# Patient Record
Sex: Female | Born: 1995 | Race: Black or African American | Hispanic: No | Marital: Single
Health system: Midwestern US, Academic
[De-identification: ages and names within clinical notes are randomized; demographics above are authoritative.]

## PROBLEM LIST (undated history)

## (undated) MED FILL — IBUPROFEN 800 MG TABLET: 800 800 MG | ORAL | Qty: 30 | Fill #0

## (undated) MED FILL — NORGESTIMATE 0.25 MG-ETHINYL ESTRADIOL 35 MCG TABLET: 0.25-35 0.25-35 mg-mcg | ORAL | 30 days supply | Qty: 28 | Fill #0

---
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
## 2014-04-29 ENCOUNTER — Ambulatory Visit (INDEPENDENT_AMBULATORY_CARE_PROVIDER_SITE_OTHER): Payer: Self-pay | Admitting: Physician Assistant

## 2014-04-29 VITALS — BP 100/58 | HR 100 | Temp 99.3°F | Resp 18 | Ht 63.0 in | Wt 110.0 lb

## 2014-04-29 DIAGNOSIS — R0981 Nasal congestion: Secondary | ICD-10-CM

## 2014-04-29 DIAGNOSIS — Z889 Allergy status to unspecified drugs, medicaments and biological substances status: Secondary | ICD-10-CM

## 2014-04-29 DIAGNOSIS — R112 Nausea with vomiting, unspecified: Secondary | ICD-10-CM

## 2014-04-29 DIAGNOSIS — Z9109 Other allergy status, other than to drugs and biological substances: Secondary | ICD-10-CM

## 2014-04-29 LAB — POCT CBC
Granulocyte percent: 83.3 %G — AB (ref 37–80)
HEMATOCRIT: 44.8 % (ref 37.7–47.9)
Hemoglobin: 14.5 g/dL (ref 12.2–16.2)
LYMPH, POC: 1.4 (ref 0.6–3.4)
MCH, POC: 29.7 pg (ref 27–31.2)
MCHC: 32.2 g/dL (ref 31.8–35.4)
MCV: 91.7 fL (ref 80–97)
MID (cbc): 0.4 (ref 0–0.9)
MPV: 8.6 fL (ref 0–99.8)
POC Granulocyte: 8.9 — AB (ref 2–6.9)
POC LYMPH PERCENT: 12.9 %L (ref 10–50)
POC MID %: 3.8 %M (ref 0–12)
Platelet Count, POC: 281 10*3/uL (ref 142–424)
RBC: 4.88 M/uL (ref 4.04–5.48)
RDW, POC: 13.3 %
WBC: 10.7 10*3/uL — AB (ref 4.6–10.2)

## 2014-04-29 MED ORDER — RANITIDINE HCL 150 MG PO TABS: 150.0000 mg | ORAL_TABLET | Freq: Two times a day (BID) | ORAL | Status: AC

## 2014-04-29 MED ORDER — CETIRIZINE HCL 10 MG PO TABS: 10.0000 mg | ORAL_TABLET | Freq: Every day | ORAL | Status: AC

## 2014-04-29 MED ORDER — HYDROXYZINE HCL 25 MG PO TABS: 25.0000 mg | ORAL_TABLET | Freq: Three times a day (TID) | ORAL | Status: AC | PRN

## 2014-04-29 NOTE — Progress Notes (Signed)
[facility] [address] [place] [address] [phone]  Date:  04/29/2014   Name:  Tina Haas   DOB:  08/22/1995   MRN:  [id number]  PCP:  Marshia Ly, PA-C    Chief Complaint: Headache; Sore Throat; Nausea; and sinus pain   History of Present Illness:  Tina Haas is a 19 y.o. very pleasant female patient who presents with the following:  Patent reports sinus pressure, sore throat, nausea, and headache for the last 2 days.  She states that the headache acutely came on, while she was doing her homework.  This was followed by a sore throat.  She states that she could not sleep due to the nausea, nasal congestion, and headache.  She states she had fever, but know temperature was not checked.  She states that the nasal congestion has resolved some, and there is still a slight headache at her left side.  She has nausea and one episode of emesis, of yellow material, that was mucus like.  She has no abdominal pain.  It did not have a strong odor.  Her cough is intermittently productive.  She has a hx of sore throat and mother is here with her because this is a concern.  This patient has had sorethroat about every other month since childhood.  It is usually strep negative.  Patient has recently received a referral for ent from pediatrician, and she also went to her student health.  Patient was dx with reflux and given prilosec.  She has not taken it for 2 days.  Today she states that she has more of an itchy throat and some nasal congestion.  Patient reports having spaghetti for dinner prior to symptom start. Through more , mother does recall an allergy of tomatoes.   Patient is not sexually active and has never been.      There are no active problems to display for this patient.   History reviewed. No pertinent past medical history.  History reviewed. No pertinent past surgical history.  History  Substance Use Topics  . Smoking status: Never Smoker    . Smokeless tobacco: Never Used  . Alcohol Use: No    History reviewed. No pertinent family history.  No Known Allergies  Medication list has been reviewed and updated.  Current Outpatient Prescriptions on File Prior to Visit  Medication Sig Dispense Refill  . norethindrone (MICRONOR,CAMILA,ERRIN) 0.35 MG tablet Take 1 tablet by mouth daily.     No current facility-administered medications on file prior to visit.    Review of Systems: ROS otherwise unremarkable unless listed above.   Physical Examination: Filed Vitals:   04/29/14 1210  BP: 100/58  Pulse: 100  Temp: 99.3 F (37.4 C)  Resp: 18   Filed Vitals:   04/29/14 1210  Height:  (1.6 m)  Weight: 110 lb (49.896 kg)   Body mass index is 19.49 kg/(m^2). Ideal Body Weight: Weight in (lb) to have BMI = 25: 140.8  Physical Exam  Constitutional: She is oriented to person, place, and time. She appears well-developed and well-nourished. No distress.  HENT:  Head: Normocephalic.  Right Ear: Tympanic membrane, external ear and ear canal normal.  Left Ear: Tympanic membrane, external ear and ear canal normal.  Nose: Mucosal edema and rhinorrhea present. Right sinus exhibits no maxillary sinus tenderness and no frontal sinus tenderness. Left sinus exhibits no maxillary sinus tenderness and no frontal sinus tenderness.  Mouth/Throat: Oropharynx is clear and moist. No  uvula swelling. No oropharyngeal exudate, posterior oropharyngeal edema or posterior oropharyngeal erythema.  Eyes: EOM are normal. Pupils are equal, round, and reactive to light. Right eye exhibits no discharge. Left eye exhibits no discharge. Right conjunctiva is not injected. Left conjunctiva is not injected.  Neck: Normal range of motion.  Cardiovascular: Normal rate, regular rhythm and normal heart sounds.   Pulmonary/Chest: Effort normal and breath sounds normal. No respiratory distress. She has no wheezes.  Abdominal: Soft. Bowel sounds are normal.  She exhibits no distension and no mass. There is no tenderness.  Lymphadenopathy:       Head (right side): No tonsillar adenopathy present.       Head (left side): No tonsillar adenopathy present.    She has no cervical adenopathy.  Neurological: She is alert and oriented to person, place, and time. She has normal reflexes. No cranial nerve deficit. Coordination normal.  Skin: She is not diaphoretic.  Psychiatric: She has a normal mood and affect. Her behavior is normal.     Results for orders placed or performed in visit on 04/29/14  POCT CBC  Result Value Ref Range   WBC 10.7 (A) 4.6 - 10.2 K/uL   Lymph, poc 1.4 0.6 - 3.4   POC LYMPH PERCENT 12.9 10 - 50 %L   MID (cbc) 0.4 0 - 0.9   POC MID % 3.8 0 - 12 %M   POC Granulocyte 8.9 (A) 2 - 6.9   Granulocyte percent 83.3 (A) 37 - 80 %G   RBC 4.88 4.04 - 5.48 M/uL   Hemoglobin 14.5 12.2 - 16.2 g/dL   HCT, POC 65.744.8 84.637.7 - 47.9 %   MCV 91.7 80 - 97 fL   MCH, POC 29.7 27 - 31.2 pg   MCHC 32.2 31.8 - 35.4 g/dL   RDW, POC 96.213.3 %   Platelet Count, POC 281 142 - 424 K/uL   MPV 8.6 0 - 99.8 fL    Assessment and Plan: 19 year old female is here for chief complaint of nausea vomiting, congestion and throat pain.  Allergic response and reflux vs viral illness.  Given recurrent hx., advising patient to schedule referral to ent, and address allergies.  Advised patient to return if sxs worsen with fever, cough, ect.    Nausea and vomiting, vomiting of unspecified type - Plan: POCT CBC, ranitidine (ZANTAC) 150 MG tablet, hydrOXYzine (ATARAX/VISTARIL) 25 MG tablet  Nasal congestion - Plan: cetirizine (ZYRTEC) 10 MG tablet, ranitidine (ZANTAC) 150 MG tablet, hydrOXYzine (ATARAX/VISTARIL) 25 MG tablet  Multiple allergies - Plan: hydrOXYzine (ATARAX/VISTARIL) 25 MG tablet, Ambulatory referral to Allergy  Trena PlattStephanie Yatzil Clippinger, PA-C [facility] 3/28/20162:10 PM

## 2014-04-29 NOTE — Patient Instructions (Addendum)
Please schedule your referral appointment Take the zantac for 30 days. Continue to take the zyrtec past this time. Please take 1/2 to 1 full tablet of vistaril at night only.   If you continue to have these symptoms, please contact your student health or return to our clinic.    Food Choices for Gastroesophageal Reflux Disease When you have gastroesophageal reflux disease (GERD), the foods you eat and your eating habits are very important. Choosing the right foods can help ease the discomfort of GERD. WHAT GENERAL GUIDELINES DO I NEED TO FOLLOW?  Choose fruits, vegetables, whole grains, low-fat dairy products, and low-fat meat, fish, and poultry.  Limit fats such as oils, salad dressings, butter, nuts, and avocado.  Keep a food diary to identify foods that cause symptoms.  Avoid foods that cause reflux. These may be different for different people.  Eat frequent small meals instead of three large meals each day.  Eat your meals slowly, in a relaxed setting.  Limit fried foods.  Cook foods using methods other than frying.  Avoid drinking alcohol.  Avoid drinking large amounts of liquids with your meals.  Avoid bending over or lying down until 2-3 hours after eating. WHAT FOODS ARE NOT RECOMMENDED? The following are some foods and drinks that may worsen your symptoms: Vegetables Tomatoes. Tomato juice. Tomato and spaghetti sauce. Chili peppers. Onion and garlic. Horseradish. Fruits Oranges, grapefruit, and lemon (fruit and juice). Meats High-fat meats, fish, and poultry. This includes hot dogs, ribs, ham, sausage, salami, and bacon. Dairy Whole milk and chocolate milk. Sour cream. Cream. Butter. Ice cream. Cream cheese.  Beverages Coffee and tea, with or without caffeine. Carbonated beverages or energy drinks. Condiments Hot sauce. Barbecue sauce.  Sweets/Desserts Chocolate and cocoa. Donuts. Peppermint and spearmint. Fats and Oils High-fat foods, including [place] fries  and potato chips. Other Vinegar. Strong spices, such as black pepper, white pepper, red pepper, cayenne, curry powder, cloves, ginger, and chili powder. The items listed above may not be a complete list of foods and beverages to avoid. Contact your dietitian for more information. Document Released: 01/18/2005 Document Revised: 01/23/2013 Document Reviewed: 11/22/2012 [facility] Patient Information 2015 [place], [place]. This information is not intended to replace advice given to you by your health care provider. Make sure you discuss any questions you have with your health care provider.

## 2014-05-02 ENCOUNTER — Telehealth: Payer: Self-pay

## 2014-05-02 NOTE — Telephone Encounter (Signed)
Nausea and vomiting, vomiting of unspecified type - Plan: POCT CBC, ranitidine (ZANTAC) 150 MG tablet, hydrOXYzine (ATARAX/VISTARIL) 25 MG tablet  Nasal congestion - Plan: cetirizine (ZYRTEC) 10 MG tablet, ranitidine (ZANTAC) 150 MG tablet, hydrOXYzine (ATARAX/VISTARIL) 25 MG tablet  Multiple allergies - Plan: hydrOXYzine (ATARAX/VISTARIL) 25 MG tablet, Ambulatory referral to Allergy  Trena PlattStephanie English, PA-C [facility] 3/28/20162:10 PM  Judeth CornfieldStephanie verbally ok'd note. Called pt to see which days she missed since she was seen on the 28th. Unable to leave message

## 2014-05-02 NOTE — Telephone Encounter (Signed)
Pt returned call, note written until 3/31. Faxed to school.

## 2014-05-02 NOTE — Telephone Encounter (Signed)
Patient is requesting a three day out of school note.    Fax Number:  [phone]  [phone]  Patient

## 2015-04-04 ENCOUNTER — Ambulatory Visit: Admit: 2015-04-04 | Discharge: 2015-04-04 | Payer: PRIVATE HEALTH INSURANCE

## 2015-04-04 ENCOUNTER — Inpatient Hospital Stay: Admit: 2015-04-04 | Payer: PRIVATE HEALTH INSURANCE

## 2015-04-04 DIAGNOSIS — M546 Pain in thoracic spine: Secondary | ICD-10-CM

## 2015-04-04 DIAGNOSIS — M545 Low back pain, unspecified: Secondary | ICD-10-CM

## 2015-04-04 DIAGNOSIS — M4185 Other forms of scoliosis, thoracolumbar region: Secondary | ICD-10-CM

## 2015-04-04 NOTE — Unmapped (Signed)
Chief Complaint   Patient presents with   ??? New Patient Visit/ Consultation     thoracic/lumbar pain       HPI:      Erica Esparza is a 20 y.o. female who presents for evaluation of mid and low back pain.  The symptoms localize to the left side of her lower thoracic/upper lumbar region.  They have been present since middle school.  She reports noticing sometime in middle school that she had a mild curve to her spine.  She thinks it may have progressed a small amount since noticing the curve.  She is unsure of any changes recently.  She takes OTC medications for pain with fair results.  She has not had any forms of therapy at this point.  She denies any numbness, tingling or weakness or any issues with bowel/bladder function.  The patient is referred by self referral.    Symptoms started:      10 years ago    History of specific injury:      No  Change in activity:       No  History of previous hip problems:    No  History of previous back problems:    No  Seen for this problem before:     No    The patient rates their pain at rest:     2/10  Pain at its worst rates:     6/10   The pain quality is:      aching  Symptoms are exacerbated by:     mechanical activity of the spine    The patient reports:    night pain:     No    weakness:     No     swelling:     No    bruising:     No    catching:     No    numbness:     No    tingling:     No    new bowel/bladder problems:   No    Prior treatment has included: Prior surgery at this site: No      Ice:    No      Pain medications:  ibuprofen and tylenol      Physical therapy:  No      Steroid injection:  No      Other:    No    Work/School:   Yes  Exercise/Sports:  Yes      History reviewed. No pertinent past medical history.      History reviewed. No pertinent past surgical history.      History reviewed. No pertinent family history.      Medication    Current outpatient prescriptions:   ???  CYANOCOBALAMIN, VITAMIN B-12, (VITAMIN B-12 ORAL), Take by mouth., Disp: , Rfl:        Allergies  No Known Allergies      Review of Symptoms    Pertinent items are noted in HPI.    Physical Examination:    Constitutional: This is a pleasant female, alert, and in no acute distress who appears stated age.    Filed Vitals:    04/04/15 1001   Height: 5' 3 (1.6 m)   Weight: 95 lb (43.092 kg)       Psychiatric:  The patient's mood is normal and appropriate for their visit       Gait:   Gait:   normal  Heel walk:  normal      Toe walk:  normal     Vascular exam:   Symmetric pulses in bilateral lower extremities, no edema.     Neuro exam:   Symmetric light touch in bilateral lower extremities     Reflexes:      Patellar:2+ bilaterally      Achilles:2+ bilaterally         Lumbar Spine exam:     On observation there is evidence of a mild thoracolumbar scoliosis    Tenderness:     Right    Left    Paraspinous muscles : []                    [x]     Mid-line:   []       []      Buttock/gluteal:  []       []      Iliac crest:   []       []      Other:    []      []      ROM:      Flexion: normal     Extension: normal              Right     Left  Rotation:  normal     normal  Lateral bend:  normal     normal        Tests:    Straight leg raise:  normal     normal           Babinski Sign:     downgoing     downgoing     Strength:      L Hip Flexors 5/5  R Hip Flexors 5/5  L Quadriceps 5/5  R Quadriceps 5/5  L Hamstring 5/5  R Hamstring 5/5  L Tibialis anterior 5/5  R Tibialis anterior 5/5  L Extensor hallucis longus 5/5  R Extensor hallucis longus 5/5  L Gastrocnemius 5/5  R Gastrocnemius 5/5      Skin:    Warm, dry    No erythema    No rash    No wounds or open lesions       Imaging:    X-rays of the thoracic and lumbar spines dated 04/04/2015 were reviewed and discussed with the patient.  These films revealed: a mild scoliotic curvature of the thoracolumbar spine measuring approximately 23 degrees.       Assessment:    1. Thoracolumbar back pain  Physical Therapy   2. Scoliosis of thoracolumbar spine, unspecified  scoliosis type  Physical Therapy   3. Adolescent idiopathic scoliosis of thoracolumbar region           Plan:    Erica Esparza is a 20 y.o. female with mild thoracic and low back pain likely secondary to musculoskeletal back pain.  I discussed her findings in detail and reassured her that her scoliosis was mild.  I told her that with her pain level being relatively mild and not having had any therapy that she should start with a course of PT and that this will likely help with her pain.    Discussed the patient's diagnosis, exam, imaging (if applicable), and treatment options:  [x]   Discussed return to activity considerations, including the need to avoid exacerbating activity, high impact, or painful activity  [x]   Discussed the use of pain medication, muscle relaxer, steroids and nonsteroidal anti-inflammatory agents including the risk of side effects which include  but not limited to:  the risk of increased BP, bleeding, renal, and heart involvement.     []  Patient to consider naprosyn or ibuprofen for 1-2 weeks with food.  The patient may also consider acetaminophen if needed and medically able to take.   []  The patient was given a prescription for a Medrol dose pack and instructed to follow package directions.    []  Prescription given for pain medication, muscle relaxer or anti-inflammatory (see Epic orders)   [x]  May continue on their previously prescribed pain regimen   [x]   Can consider Heat/Ice 3-4 x/day for 15-20 min for adjunctive pain control.  [x]   Discussed referral to Pain Management, Physical Medicine & Rehabilitation and/or Psychiatry    []  Patient elected for a referral to   [x]   Weight management discussed as part of a healthy spine plan.   [x]   Reviewed the role of a rehabilitative exercise program:   [x]  Patient elected for a referral to physical therapy     []  Patient elected for a home based program and instruction was given in the                    office today  [x]   Reviewed the role of  additional diagnostic and treatment options including the role of imaging, EMG, injection therapy, and possible surgical considerations.  At this time we elected to:   [x]  Monitor their response to initial treatment recommendations prior to pursing these additional options which can be considered based on their progress.     []  Further diagnostic testing orders were placed for.   []  Referral for steroid injections was placed.   []  Referral to surgeon for consult to discuss surgical options was placed  Follow-up    [x]  prn.  Patient was instructed to contact us with any problems, questions or any neurological change/deterioration.    []    A copy of my note was sent electronically to the referring provider through our shared medical record or automated fax system.    Duane Lope, PA-C  04/04/2015
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
## 2015-05-11 DIAGNOSIS — R1013 Epigastric pain: Secondary | ICD-10-CM

## 2015-05-11 NOTE — Unmapped (Signed)
Patient presents to ED with chief complaints of nausea without emesis, abd pain with what patient believes is acid reflux x 4 days beginning Wednesday morning.  S/s have been getting progressively worse.

## 2015-05-12 ENCOUNTER — Inpatient Hospital Stay
Admit: 2015-05-12 | Discharge: 2015-05-12 | Disposition: A | Discharge status: Home / Self Care | Payer: PRIVATE HEALTH INSURANCE

## 2015-05-12 LAB — BASIC METABOLIC PANEL
Anion Gap: 8 mmol/L (ref 3–16)
BUN: 17 mg/dL (ref 7–25)
CO2: 25 mmol/L (ref 21–33)
Calcium: 9.7 mg/dL (ref 8.6–10.3)
Chloride: 102 mmol/L (ref 98–110)
Creatinine: 1.12 mg/dL (ref 0.60–1.30)
Glucose: 172 mg/dL (ref 70–100)
Osmolality, Calculated: 286 mOsm/kg (ref 278–305)
Potassium: 6 mmol/L (ref 3.5–5.3)
Sodium: 135 mmol/L (ref 133–146)
eGFR AA CKD-EPI: 82 See note.
eGFR NONAA CKD-EPI: 71 See note.

## 2015-05-12 LAB — DIFFERENTIAL
Basophils Absolute: 34 /uL (ref 0–200)
Basophils Relative: 0.6 % (ref 0.0–1.0)
Eosinophils Absolute: 112 /uL (ref 15–500)
Eosinophils Relative: 2 % (ref 0.0–8.0)
Lymphocytes Absolute: 2274 /uL (ref 850–3900)
Lymphocytes Relative: 40.6 % (ref 15.0–45.0)
Monocytes Absolute: 577 /uL (ref 200–950)
Monocytes Relative: 10.3 % (ref 0.0–12.0)
Neutrophils Absolute: 2604 /uL (ref 1500–7800)
Neutrophils Relative: 46.5 % (ref 40.0–80.0)
nRBC: 0 /100 WBC (ref 0–0)

## 2015-05-12 LAB — URINALYSIS-MACROSCOPIC W/REFLEX TO MICROSCOPIC
Bilirubin, UA: NEGATIVE
Blood, UA: NEGATIVE
Glucose, UA: NEGATIVE mg/dL
Ketones, UA: NEGATIVE mg/dL
Leukocytes, UA: NEGATIVE
Nitrite, UA: NEGATIVE
Protein, UA: NEGATIVE mg/dL
Specific Gravity, UA: 1.025 (ref 1.005–1.035)
Urobilinogen, UA: 1 EU/dL (ref 0.2–1.0)
pH, UA: 6 (ref 5.0–8.0)

## 2015-05-12 LAB — CBC
Hematocrit: 41 % (ref 35.0–45.0)
Hemoglobin: 13.7 g/dL (ref 11.7–15.5)
MCH: 30.4 pg (ref 27.0–33.0)
MCHC: 33.4 g/dL (ref 32.0–36.0)
MCV: 91.2 fL (ref 80.0–100.0)
MPV: 9.5 fL (ref 7.5–11.5)
Platelets: 267 10*3/uL (ref 140–400)
RBC: 4.5 10*6/uL (ref 3.80–5.10)
RDW: 13.2 % (ref 11.0–15.0)
WBC: 5.6 10*3/uL (ref 3.8–10.8)

## 2015-05-12 LAB — HEPATIC FUNCTION PANEL
ALT: 14 U/L (ref 7–52)
AST: 35 U/L (ref 13–39)
Albumin: 4.2 g/dL (ref 3.5–5.7)
Alkaline Phosphatase: 63 U/L (ref 36–125)
Bilirubin, Direct: 0 mg/dL (ref 0.0–0.4)
Bilirubin, Indirect: 0.3 mg/dL (ref 0.0–1.1)
Total Bilirubin: 0.3 mg/dL (ref 0.0–1.5)
Total Protein: 7.7 g/dL (ref 6.4–8.9)

## 2015-05-12 LAB — HCG URINE, QUALITATIVE: Preg Test, Ur: NEGATIVE

## 2015-05-12 LAB — LIPASE: Lipase: 24 U/L (ref 4–82)

## 2015-05-12 LAB — POTASSIUM: Potassium: 3.7 mmol/L (ref 3.5–5.3)

## 2015-05-12 MED ORDER — ondansetron (ZOFRAN ODT) 4 MG disintegrating tablet
4 | ORAL_TABLET | Freq: Three times a day (TID) | ORAL | Status: AC | PRN
Start: 2015-05-12 — End: 2015-09-01

## 2015-05-12 MED ORDER — lidocaine HCl (XYLOCAINE) 2 % viscous soln Soln 15 mL
2 | Freq: Once | Status: AC
Start: 2015-05-12 — End: 2015-05-12
  Administered 2015-05-12: 05:00:00 15 mL via ORAL

## 2015-05-12 MED ORDER — aluminum & magnesium hydroxide-simethicone (MYLANTA, MAALOX) suspension 30 mL
400-400-40 | Freq: Once | ORAL | Status: AC
Start: 2015-05-12 — End: 2015-05-12
  Administered 2015-05-12: 05:00:00 30 mL via ORAL

## 2015-05-12 MED ORDER — ondansetron (ZOFRAN) 4 mg/2 mL injection 4 mg
4 | Freq: Once | INTRAMUSCULAR | Status: AC
Start: 2015-05-12 — End: 2015-05-12
  Administered 2015-05-12: 05:00:00 4 mg via INTRAVENOUS

## 2015-05-12 MED ORDER — lactated ringers 1,000 mL IV fluid
Freq: Once | INTRAVENOUS | Status: AC
Start: 2015-05-12 — End: 2015-05-12
  Administered 2015-05-12: 05:00:00 via INTRAVENOUS

## 2015-05-12 MED ORDER — pantoprazole (PROTONIX) 20 MG tablet
20 | ORAL_TABLET | Freq: Every morning | ORAL | Status: AC
Start: 2015-05-12 — End: 2015-09-01

## 2015-05-12 MED FILL — ONDANSETRON HCL (PF) 4 MG/2 ML INJECTION SOLUTION: 4 4 mg/2 mL | INTRAMUSCULAR | Qty: 2

## 2015-05-12 MED FILL — LIDOCAINE VISCOUS 2 % MUCOSAL SOLUTION: 2 2 % | Qty: 15

## 2015-05-12 MED FILL — MAG-AL PLUS EXTRA STRENGTH 400 MG-400 MG-40 MG/5 ML ORAL SUSPENSION: 400-400-40 400-400-40 mg/5 mL | ORAL | Qty: 30

## 2015-05-12 NOTE — Unmapped (Signed)
ED Attending Attestation Note    Date of service:  05/11/2015    This patient was seen by the resident physician.  I have seen and examined the patient, agree with the workup, evaluation, management and diagnosis. The care plan has been discussed and I concur.     My assessment reveals a 20 y.o. female who presents emergency Department with a four-day history of a cold, characterized by a nonproductive cough without shortness of breath and then she awakened this morning and noted nausea with reduced appetite but she has not had any vomiting.  She had a bowel movement earlier today which was normal for her.  She just completed a menstrual period which she also reports was normal in duration, and timing for her.  She denies that she's having any urinary frequency, urinary hesitancy, or blood in her urine.  There's been no unusual odor to the urine.  She has felt hot but has not documented a fever.  She reports a past history of reflux which she states makes my throat swell.  She has not had abdominal pain associate with reflux symptoms in the past.  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    On examination she was in no acute distress.  Her skin was warm and dry.  There is no conjunctival pallor or scleral icterus.  Her oropharynx is clear and mucous membranes are moist and pink.  Her lungs are clear with equal breath sounds and there were no wheezes rales or rhonchi.  Air movement was good.  Cardiac exam showed a regular rate and rhythm with a normal S1 and S2 that she was tachycardic to 110 at the time of my examination.  Her abdomen had intact bowel sounds.  It was soft and flat.  She had very mild suprapubic tenderness and left upper quadrant tenderness without rebound or guarding.  There is lesser tenderness in her periumbilical region.  The remainder the abdomen was nontender.Marland Kitchen

## 2015-05-12 NOTE — Unmapped (Signed)
You were seen in the Emergency Department today for abdominal pain.  Take pantoprazole once daily.  Take Zofran as needed for nausea.    Drink plenty of fluids (at least 3-5 glasses of water each day) and be sure to eat plenty of fiber.     Return to the Emergency Department if:     ?? you develop any fevers   ?? you experience worsening or new abdominal pain   ?? you experience nausea or vomiting that keeps you from being able to eat or drink  ?? or you have any new concerns.    Please follow-up with your primary care doctor within 1 week if you continue to experience new or ongoing symptoms.

## 2015-05-12 NOTE — Unmapped (Signed)
[place] ED Note    Date of service:  05/11/2015    Reason for Visit: Nausea; Gastroesophageal Reflux; and Abdominal Pain      Patient History     HPI:  Bryanah Sidell is a 20 y.o. female with PMHx of GERD  presents with chief complaint of Nausea; Gastroesophageal Reflux; and Abdominal Pain    Patient presents for abdominal pain.The patient, she's had abdominal pain since Wednesday, 3 days.  She describes it as epigastric, associated with reflux, nonradiating, constant.  It is not associated with food.  It is similar to previous episodes of gastroesophageal reflux which is on the past.  She has had nausea but no emesis.  She denies diarrhea.  She has no lower abdominal pain, dysuria, urgency, frequency.  She just ended her menstrual period.    Aside from the above, patient denies any aggravating or alleviating factors or associated symptoms.          Past Medical History   Diagnosis Date   ??? GERD (gastroesophageal reflux disease)        History reviewed. No pertinent past surgical history.    Yajahira Tison  reports that she has never smoked. She does not have any smokeless tobacco history on file. She reports that she does not drink alcohol or use illicit drugs.    Previous Medications    CYANOCOBALAMIN, VITAMIN B-12, (VITAMIN B-12 ORAL)    Take by mouth.       Allergies:   Allergies as of 05/11/2015   ??? (No Known Allergies)       Review of Systems     ROS:  As in HPI. All systems reviewed and otherwise negative. Specifically, positive forEpigastric pain, nausea but no fever, chills, headache and weakness, numbness, shortness of breath, chest pain  ROS    Physical Exam     ED Triage Vitals   Vital Signs Group      Temp 05/12/15 0002 98.4 ??F (36.9 ??C)      Temp Source 05/12/15 0002 Oral      Heart Rate 05/12/15 0002 122      Heart Rate Source 05/12/15 0002 Monitor      Resp 05/12/15 0002 15      SpO2 05/12/15 0002 100 %      BP 05/12/15 0002 115/85 mmHg      BP  Location 05/12/15 0002 Left arm      BP Method 05/12/15 0002 Automatic      Patient Position 05/12/15 0002 Sitting   SpO2 05/12/15 0002 100 %   O2 Device 05/12/15 0002 None (Room air)       General:Well-appearing African American female in no apparent distress    HEENT:  Normocephalic, atraumatic. Mucous membranes are moist.     Neck:  Supple, trachea midline      Pulmonary:   Lungs clear to auscultation bilaterally. No increased work of breathing.    Cardiac:  Regular rate and rhythm. Normal S1 and S2. No murmurs/rubs/gallops.    Abdomen: Soft, tender to palpation of the epigastrium, no rebound, no guarding.  No lower abdominal tenderness.  Normal active bowel sounds.    Extremities: 2+ radial pulses, no deformity, swelling, tenderness     Skin:  No rashes or bruising,    Neuro:  Alert and oriented x3. Speech normal. Moves UE and LE spontaneously.     Psych:  Mood and affect appropriate for situation.       Diagnostic Studies  Labs:    Please see electronic medical record for any tests performed in the ED     Radiology:    Please see electronic medical record for any tests performed in the ED    EKG:  None     Emergency Department Procedures     Procedures      ED Course and MDM     Brendalee Matthies is a 20 y.o. female with a history and presentation as described above in HPI.  The patient was evaluated by myself and the ED Attending Physician, Dr. Marylou Flesher. All management and disposition plans were discussed and agreed upon. The patient was roomed and an IV was placed. Appropriate labs and diagnostic studies were reviewed as they were made available. Pertinent laboratory studies in medical decision making are listed below.     Upon presentation, the patient was Given an enema was stable.    Patient arrived for epigastric pain.  She has a history of reflux.  She had no peritoneal signs on abdominal exam.  She had no right upper quadrant tenderness consistent with hepatobiliary pathology.  She had no leukocytosis,  LFTs and lipase within normal limits, BMP showed potassium of 6.0 initially, repeat was 3.7.  Patient was given 1 L bolus, GI cocktail which helped with her pain.  She was given Zofran which alleviated her nausea.    I believe patient has dyspepsia.  She'll be put on pantoprazole daily.  She'll take Zofran as needed for nausea.  She is instructed to return for new or worsening symptoms, inability to tolerate by mouth.    Risks, benefits, and alternatives were discussed. At this time the patient has been deemed safe for discharge. My customary discharge instructions including strict return precautions for worsening or new symptoms have been communicated.    Consults:      None   Summary of Treatment in ED:    Medications   lactated ringers 1,000 mL IV fluid ( Intravenous New Bag 05/12/15 0101)   ondansetron (ZOFRAN) 4 mg/2 mL injection 4 mg (4 mg Intravenous Given 05/12/15 0102)   aluminum & magnesium hydroxide-simethicone (MYLANTA, MAALOX) suspension 30 mL (30 mLs Oral Given 05/12/15 0102)     And   lidocaine HCl (XYLOCAINE) 2 % viscous soln Soln 15 mL (15 mLs Oral Given 05/12/15 0102)           Impression     1. Dyspepsia             Cecilie Kicks, MD  Resident  05/12/15 [phone]

## 2015-05-12 NOTE — Unmapped (Signed)
Pt updated on plan of care. No questions at this time. Family at bedside.

## 2015-05-12 NOTE — Unmapped (Signed)
Pt c/o abdominal pain and nausea starting early Sunday morning. Pt denies vomiting. Pt denies chest pain or shortness of breath.

## 2015-08-29 ENCOUNTER — Encounter: Payer: PRIVATE HEALTH INSURANCE | Attending: Adult Health

## 2015-09-01 ENCOUNTER — Encounter: Payer: PRIVATE HEALTH INSURANCE | Attending: Adult Health

## 2015-09-01 ENCOUNTER — Ambulatory Visit: Admit: 2015-09-01 | Payer: PRIVATE HEALTH INSURANCE

## 2015-09-01 DIAGNOSIS — N946 Dysmenorrhea, unspecified: Secondary | ICD-10-CM

## 2015-09-01 MED ORDER — ondansetron (ZOFRAN) 4 MG tablet
4 | ORAL_TABLET | Freq: Three times a day (TID) | ORAL | Status: AC | PRN
Start: 2015-09-01 — End: 2017-02-25

## 2015-09-01 NOTE — Unmapped (Signed)
Subjective  HPI:   Patient ID: Erica Esparza is a 20 y.o. female.    Chief Complaint: 'Endometriosis' per pt    HPI Comments:   Pt c/o intermittent nausea and lower abdominal/pelvic cramping x 2-3 days  Pt admits to hx of dysmenorrhea, usually occurs on first day of menses, lasts all day  She feels her sxns have become more severe over past few months  Has not tried any rx or otc meds for recent episode  Pt states her old OBGYN had dx endometriosis in past, but pt denies hx of imaging or biopsy    LMP: 08/30/15; States usually regular in cycle/duration  Birth control: N/A at this time, pt self d/c last August due to no menstrual sx relief  Sexual hx: Currently in relationship with female partner, denies sexual activity at this time  Last saw OBGYN in hometown 1-2 years ago; Has not established GYN locally             Medications:   Current Outpatient Prescriptions on File Prior to Visit   Medication Sig Dispense Refill   ??? [DISCONTINUED] CYANOCOBALAMIN, VITAMIN B-12, (VITAMIN B-12 ORAL) Take by mouth.     ??? [DISCONTINUED] ondansetron (ZOFRAN ODT) 4 MG disintegrating tablet Take 1 tablet (4 mg total) by mouth every 8 hours as needed for Nausea. 20 tablet 0   ??? [DISCONTINUED] pantoprazole (PROTONIX) 20 MG tablet Take 1 tablet (20 mg total) by mouth every morning before breakfast. 30 tablet 0     No current facility-administered medications on file prior to visit.            ROS:   Review of Systems   Constitutional: Positive for appetite change. Negative for fever, chills and fatigue.   Gastrointestinal: Negative for abdominal pain.   Genitourinary: Positive for menstrual problem and pelvic pain. Negative for vaginal pain.   Neurological: Negative for headaches.       Filed Vitals:    09/01/15 1044   BP: 127/73   Pulse: 91   Temp: 98 ??F (36.7 ??C)   TempSrc: Oral   Height: 5' 2.99 (1.6 m)   Weight: 99 lb (44.906 kg)     Body mass index is 17.54 kg/(m^2).  Body surface area is 1.41 meters squared.     Objective:          Physical Exam   Constitutional: She is oriented to person, place, and time. She appears well-developed and well-nourished.   HENT:   Head: Normocephalic and atraumatic.   Cardiovascular: Normal rate, regular rhythm and normal heart sounds.    Pulmonary/Chest: Effort normal and breath sounds normal. No respiratory distress.   Abdominal: Soft. Bowel sounds are normal. She exhibits no distension. There is tenderness. There is no rebound and no guarding.       Neurological: She is alert and oriented to person, place, and time.               Assessment/Plan:     1. Dysmenorrhea  - Gynecology referral provided      - Ondansetron (ZOFRAN) 4 MG tablet: Take PRN nausea as directed

## 2015-09-01 NOTE — Unmapped (Signed)
Dysmenorrhea  Menstrual cramps (dysmenorrhea) are caused by the muscles of the uterus tightening (contracting) during a menstrual period. For some women, this discomfort is merely bothersome. For others, dysmenorrhea can be severe enough to interfere with everyday activities for a few days each month.  Primary dysmenorrhea is menstrual cramps that last a couple of days when you start having menstrual periods or soon after. This often begins after a teenager starts having her period. As a woman gets older or has a baby, the cramps will usually lessen or disappear. Secondary dysmenorrhea begins later in life, lasts longer, and the pain may be stronger than primary dysmenorrhea. The pain may start before the period and last a few days after the period.   CAUSES   Dysmenorrhea is usually caused by an underlying problem, such as:  ?? The tissue lining the uterus grows outside of the uterus in other areas of the body (endometriosis).  ?? The endometrial tissue, which normally lines the uterus, is found in or grows into the muscular walls of the uterus (adenomyosis).  ?? The pelvic blood vessels are engorged with blood just before the menstrual period (pelvic congestive syndrome).  ?? Overgrowth of cells (polyps) in the lining of the uterus or cervix.  ?? Falling down of the uterus (prolapse) because of loose or stretched ligaments.  ?? Depression.  ?? Bladder problems, infection, or inflammation.  ?? Problems with the intestine, a tumor, or irritable bowel syndrome.  ?? Cancer of the female organs or bladder.  ?? A severely tipped uterus.  ?? A very tight opening or closed cervix.  ?? Noncancerous tumors of the uterus (fibroids).  ?? Pelvic inflammatory disease (PID).  ?? Pelvic scarring (adhesions) from a previous surgery.  ?? Ovarian cyst.  ?? An intrauterine device (IUD) used for birth control.  RISK FACTORS  You may be at greater risk of dysmenorrhea if:  ?? You are younger than age 30.  ?? You started puberty early.  ?? You have  irregular or heavy bleeding.  ?? You have never given birth.  ?? You have a family history of this problem.  ?? You are a smoker.  SIGNS AND SYMPTOMS   ?? Cramping or throbbing pain in your lower abdomen.  ?? Headaches.  ?? Lower back pain.  ?? Nausea or vomiting.  ?? Diarrhea.  ?? Sweating or dizziness.  ?? Loose stools.  DIAGNOSIS   A diagnosis is based on your history, symptoms, physical exam, diagnostic tests, or procedures. Diagnostic tests or procedures may include:  ?? Blood tests.  ?? Ultrasonography.  ?? An examination of the lining of the uterus (dilation and curettage, D&C).  ?? An examination inside your abdomen or pelvis with a scope (laparoscopy).  ?? X-rays.  ?? CT scan.  ?? MRI.  ?? An examination inside the bladder with a scope (cystoscopy).  ?? An examination inside the intestine or stomach with a scope (colonoscopy, gastroscopy).  TREATMENT   Treatment depends on the cause of the dysmenorrhea. Treatment may include:  ?? Pain medicine prescribed by your health care provider.  ?? Birth control pills or an IUD with progesterone hormone in it.  ?? Hormone replacement therapy.  ?? Nonsteroidal anti-inflammatory drugs (NSAIDs). These may help stop the production of prostaglandins.  ?? Surgery to remove adhesions, endometriosis, ovarian cyst, or fibroids.  ?? Removal of the uterus (hysterectomy).  ?? Progesterone shots to stop the menstrual period.  ?? Cutting the nerves on the sacrum that go to the female organs (  presacral neurectomy).  ?? Electric current to the sacral nerves (sacral nerve stimulation).  ?? Antidepressant medicine.  ?? Psychiatric therapy, counseling, or group therapy.  ?? Exercise and physical therapy.  ?? Meditation and yoga therapy.  ?? Acupuncture.  HOME CARE INSTRUCTIONS   ?? Only take over-the-counter or prescription medicines as directed by your health care provider.  ?? Place a heating pad or hot water bottle on your lower back or abdomen. Do not sleep with the heating pad.  ?? Use aerobic exercises, walking,  swimming, biking, and other exercises to help lessen the cramping.  ?? Massage to the lower back or abdomen may help.  ?? Stop smoking.  ?? Avoid alcohol and caffeine.  SEEK MEDICAL CARE IF:   ?? Your pain does not get better with medicine.  ?? You have pain with sexual intercourse.  ?? Your pain increases and is not controlled with medicines.  ?? You have abnormal vaginal bleeding with your period.  ?? You develop nausea or vomiting with your period that is not controlled with medicine.  SEEK IMMEDIATE MEDICAL CARE IF:   You pass out.      This information is not intended to replace advice given to you by your health care provider. Make sure you discuss any questions you have with your health care provider.     Document Released: 01/18/2005 Document Revised: 09/20/2012 Document Reviewed: 07/06/2012  Elsevier Interactive Patient Education ??2016 [employer]

## 2015-09-18 ENCOUNTER — Ambulatory Visit: Payer: PRIVATE HEALTH INSURANCE

## 2015-09-24 ENCOUNTER — Ambulatory Visit: Payer: PRIVATE HEALTH INSURANCE

## 2015-10-16 ENCOUNTER — Ambulatory Visit: Payer: PRIVATE HEALTH INSURANCE

## 2015-10-16 NOTE — Progress Notes (Deleted)
Chief complaint: ***    HPI:  Erica Esparza is a 20 y.o. [id number] who presents for evaluation of above. Patient reports ***    I have reviewed and updated the patient's past medical, surgical, family, social history, medications and allergies    ***    O:  There were no vitals taken for this visit.    Gen: NAD, A&O   HEENT: atraumatic, normocephalic   Chest: normal work of breathing   Abd: soft, ***  Pelvic: normal appearing external genitalia, urethral meatus, normal appearing vaginal mucosa, normal appearing visually closed cervix with scant discharge.***  Bimanual: small, firm, mobile uterus in midline, ***verted, no appreciable adnexal masses or tenderness, no cervical motion tenderness    ***    A/P:  ***    Dispo: ***    Reinaldo Raddle, MD

## 2015-11-26 ENCOUNTER — Inpatient Hospital Stay
Admit: 2015-11-26 | Discharge: 2015-11-26 | Disposition: A | Discharge status: Home / Self Care | Payer: PRIVATE HEALTH INSURANCE

## 2015-11-26 DIAGNOSIS — E162 Hypoglycemia, unspecified: Secondary | ICD-10-CM

## 2015-11-26 LAB — CBC
Hematocrit: 38.5 % (ref 35.0–45.0)
Hemoglobin: 13.3 g/dL (ref 11.7–15.5)
MCH: 32.2 pg (ref 27.0–33.0)
MCHC: 34.4 g/dL (ref 32.0–36.0)
MCV: 93.7 fL (ref 80.0–100.0)
MPV: 9.2 fL (ref 7.5–11.5)
Platelets: 216 10*3/uL (ref 140–400)
RBC: 4.12 10*6/uL (ref 3.80–5.10)
RDW: 13.6 % (ref 11.0–15.0)
WBC: 5.6 10*3/uL (ref 3.8–10.8)

## 2015-11-26 LAB — BASIC METABOLIC PANEL
Anion Gap: 7 mmol/L (ref 3–16)
BUN: 7 mg/dL (ref 7–25)
CO2: 24 mmol/L (ref 21–33)
Calcium: 8.7 mg/dL (ref 8.6–10.3)
Chloride: 108 mmol/L (ref 98–110)
Creatinine: 0.88 mg/dL (ref 0.60–1.30)
Glucose: 167 mg/dL (ref 70–100)
Osmolality, Calculated: 290 mOsm/kg (ref 278–305)
Potassium: 3.8 mmol/L (ref 3.5–5.3)
Sodium: 139 mmol/L (ref 133–146)
eGFR AA CKD-EPI: >90 See note.
eGFR NONAA CKD-EPI: >90 See note.

## 2015-11-26 LAB — DIFFERENTIAL
Basophils Absolute: 34 /uL (ref 0–200)
Basophils Relative: 0.6 % (ref 0.0–1.0)
Eosinophils Absolute: 34 /uL (ref 15–500)
Eosinophils Relative: 0.6 % (ref 0.0–8.0)
Lymphocytes Absolute: 795 /uL (ref 850–3900)
Lymphocytes Relative: 14.2 % (ref 15.0–45.0)
Monocytes Absolute: 358 /uL (ref 200–950)
Monocytes Relative: 6.4 % (ref 0.0–12.0)
Neutrophils Absolute: 4379 /uL (ref 1500–7800)
Neutrophils Relative: 78.2 % (ref 40.0–80.0)
nRBC: 0 /100 WBC (ref 0–0)

## 2015-11-26 LAB — HEMOGLOBIN A1C: Hemoglobin A1C: 4.9 % (ref 4.8–6.4)

## 2015-11-26 LAB — TSH: TSH: 0.7 u[IU]/mL (ref 0.34–5.60)

## 2015-11-26 LAB — PREGNANCY QUALITATIVE, SERUM, UCMC ONLY: Preg, Serum: NEGATIVE

## 2015-11-26 MED ORDER — blood-glucose meter (GLUCOSE MONITORING KIT) kit
PACK | 0 refills | 30.00000 days | Status: AC
Start: 2015-11-26 — End: ?

## 2015-11-26 MED ORDER — blood sugar diagnostic (GLUCOSE BLOOD) Strp
0 refills | 30.00000 days | Status: AC
Start: 2015-11-26 — End: ?

## 2015-11-26 MED ORDER — sodium chloride 0.9 % 1,000 mL IV fluid
Freq: Once | INTRAVENOUS | Status: AC
Start: 2015-11-26 — End: 2015-11-26
  Administered 2015-11-26: 15:00:00 1000 mL via INTRAVENOUS

## 2015-11-26 NOTE — ED Notes (Signed)
Phlebotomy recalled for blood to be drawn- enroute

## 2015-11-26 NOTE — ED Triage Notes (Signed)
Patient had episode of syncope at school followed by a seizure. Found to have low BS. Did not eat this morning. Gives history of similar events in the past

## 2015-11-26 NOTE — Consults (Signed)
ENDOCRINOLOGY CONSULT NOTE      11/26/2015  4:22 PM  Referring Physician: Dr Dartha Lodge  Consult Attending: Dr [name] is an 20 y.o. female.     Reason for Consult: Hypoglycemia    Chief Complaint and History of Present Illness:   Patient is a 20 year old female with a PMH of GERD who presents today after an episode of hypoglycemia. Patient states that she woke at 8:30 am and went to class at 9 am. She denied eating anything. She had been feeling nauseous on waking up. She felt light headed and had a feeling of passing out. She was too weak to leave the class. She remembers blanking out intermittently and was told that she had a seizure. EMS was called. Glucose check was 37. She took 2 glucagon gel packets (presume she meant glucose gel packets) and felt better after 15 minutes. Once she had received IVF, she felt a lot better.   In the past, she has had her glucose checked once during a class. She was told her glucose was really low despite eating an hour ago. She thinks it was ~60. She describes frequent episodes of nausea/vomiting/light headedness. She reported episodes since the age of 41. Around 10 episodes since that age. However, on further questioning she described more frequent episodes near her period time. She has a suspicion for endometriosis. On first days, she feels weak along with associated symptoms. Feels better after she eats food. She feels like her symptoms come on after prolonged fasting or skipping meals.   She speculates that her mother has current diabetes since she still checks her glucose. She had gestational DM since 12 years. Patient can not state for sure if her mother takes medications for diabetes.    History:  Past Medical History:   Diagnosis Date    GERD (gastroesophageal reflux disease)      History reviewed. No pertinent surgical history.  Family History   Problem Relation Age of Onset    Diabetes Mother      gestational dm     Social History   Substance Use  Topics    Smoking status: Never Smoker    Smokeless tobacco: Never Used    Alcohol use No     No Known Allergies    Prior to Admission Meds:  (Not in a hospital admission)  Recent Steroid Use: No  Code Status: Full Code  Pertinent items are noted in HPI.    Vitals:  Blood pressure 131/72, pulse 99, temperature 98 F (36.7 C), temperature source Oral, resp. rate 10, last menstrual period 11/26/2015, SpO2 99 %.    BP 131/72 (BP Location: Right arm, Patient Position: Sitting)   Pulse 99   Temp 98 F (36.7 C) (Oral)   Resp 10   LMP 11/26/2015 (Exact Date)   SpO2 99%     General Appearance:    Alert, cooperative, no distress, appears stated age   Head:    Normocephalic, without obvious abnormality, atraumatic   Eyes:    PERRL, conjunctiva/corneas clear, EOM's intact   Throat:   Lips, mucosa, and tongue normal; teeth and gums normal   Neck:   Supple, symmetrical   Back:     Symmetric, no curvature, ROM normal, no CVA tenderness   Lungs:     Clear to auscultation bilaterally, respirations unlabored    Heart:    Regular rate and rhythm, S1 and S2 normal, no murmur, rub   or  gallop   Abdomen:     Soft, non-tender, bowel sounds active all four quadrants,     no masses, no organomegaly   Extremities:   Extremities normal, atraumatic, no cyanosis or edema   Pulses:   2+ and symmetric all extremities   Skin:   Skin color, texture, turgor normal, no rashes or lesions   Neurologic:  No focal deficits         Labs:Lab Results   Component Value Date    GLUCOSE 167 (H) 11/26/2015    BUN 7 11/26/2015    CREATININE 0.88 11/26/2015    K 3.8 11/26/2015    NA 139 11/26/2015    CL 108 11/26/2015    CALCIUM 8.7 11/26/2015     Lab Results   Component Value Date    HGBA1C 4.9 11/26/2015     Lab Results   Component Value Date    WBC 5.6 11/26/2015    HGB 13.3 11/26/2015    HCT 38.5 11/26/2015    MCV 93.7 11/26/2015    PLT 216 11/26/2015     Lab Results   Component Value Date    TSH 0.70 11/26/2015     No results found for:  TSHRECEPTAB  No results found for: THYROGLB  No results found for: ANTITHYPERAB  No results found for: PROLACTIN, LABPROL, LABLH, FSH, LABFSH, TESTOSTERONE, ESTROGEN, LABIGF, GH, LABACTH, PREGNENACTH, LABACTH, CORTISOL  Lab Results   Component Value Date    OSMOLALITY 290 11/26/2015     No results found for: LABPROT  Lab Results   Component Value Date    CALCIUM 8.7 11/26/2015       Assessment/Plan:    Patient is a 20 year old female with a PMH of GERD presenting with hypoglycemia    Recommendations  - Looks like there may be a physiological reason for her hypoglycemia in relation to her periods vs dietary indiscretions  - Recommend keeping a diary of symptoms to help [place] ascertain the frequency  - Advised to maintain a well balanced diet  - Patient will need a glucometer along with strips to measure her glucose on a PRN basis when these episodes occur  - We will plan on setting an appointment for a continuous glucose monitor for 2 week around the time of her menstrual cycle  - She will need a fasting BMP, proinsulin, c-peptide, insulin levels, Hba1c and a fructosamine before her appt  - We would like to see her in clinic once all the data is accumulated. Please place an outpatient consult for an endocrine referral.     Thank you for the consult  Patient was seen and discussed with Dr Noel Gerold    Endocrine Fellow Pager: [phone]  Tish Men  11/26/2015       I have seen and examined Erica Esparza & reviewed pertinent medical data on 11/26/15.  I discussed with the fellow and agree with the findings and plan as documented in the note.    History available from both the patient and her boy friend with whom she lives.  Initial history was of a limited number of spells suggestive of nervousness and anxiety documented on limited occasions in this petite (BMI 17.5) 20 yo but as we went on there seemed to be some longer duration.  We elicited some history to suggest that these were more frequent just after her menstrual  periods analogous to catamenial pattern of hypoglycemia in some women with diabetes corresponding to when insulin resistance rising prior to  menses is reversed.    Some suggestion that this may be related to patient's nutrition.  My suspicion for autonomous endogenous insulin secretion is low and the duration of symptoms elicited lowers the likelihood of exogenous insulin and/or insulin secretagogue but I did recommend getting a urine sulfonylurea level at the time of this ER visit.    New availability of 2 week long, modest cost continuous glucose monitoring without need for fingersticks for calibration provides the opportunity to do the equivalent of a Holter for glucose while patient in her home environment.  Given the history reported with some ambiguity as to whether or not it is related to menses, I suggested that the initial testing be timed to start one week before menses and run for two weeks until one week after start of menses.  We can re-test from there.      Ellamae Sia, MD  12/11/2015  11:17 AM

## 2015-11-26 NOTE — ED Notes (Signed)
Pt resting in bed quietly. Pt updated on plan of care, denies any needs at this time.

## 2015-11-26 NOTE — ED Provider Notes (Signed)
ED Attending Attestation Note    Date of service:  11/26/2015    This patient was seen by the advanced practice provider.  I have seen and examined the patient, agree with the workup, evaluation, management and diagnosis. The care plan has been discussed.  I have reviewed the ECG and concur with the advanced practice provider's interpretation.    My assessment reveals a 20 y.o. female with sudden onset lightheadedness, subsequent seizure.  FSBS on scene 30.  Given glucagon and feels better.  Will evaluate labs for possible causes of hypoglycemia, however ultimately if labs are unremarkable and no further seizure activity, this was likely a hypoglycemia related seizure.  Will refer to primary care, encourage regular meals and having snacks available to her.

## 2015-11-26 NOTE — ED Triage Notes (Signed)
Patient presents to the Emergency Department with report from EMS of patient with seizure.  Patient with no history of seizures.  Patient found with glucose of 31 and given Glucagon X2.  Patient now with glucose of 97.  Patient denies nausea, vomiting, or diarrhea.  Patient is awake, A&OX4.  No acute distress noted.  Respirations easy and unlabored.  Skin warm and dry.

## 2015-11-26 NOTE — Unmapped (Signed)
[place] ED Note    Date of Service: 11/26/2015    Reason for Visit: Hypoglycemia-Symptomatic      Patient History     HPI:  This is a 20 y.o. female who denies significant PMH presenting with syncope vs seizure. Patient reports that she was in class around 9AM. She reports that she was seated at her desk when she began to feel lightheaded, warm and nauseous. She reports that classmates/teachers elevated her lower extremities and laid her on the ground. She reports feeling that her feet were numb. She reports that she was told she then had a seizure. No reported tongue biting or incontinence. Patient reports she may have been confused afterwards. She was noted by EMS on arrival to have a glucose of 31. She was given Glucagon x2 and now has a glucose of 97. She reports that she now feels improved. She denies HA, dizziness, vision changes, cp, sob, n/v, focal weakness, numbness/tingling currently. She reports that she has had syncopal episodes in the past but she has never been worked up for it. She states that she usually eats something and feels better. She states that she had nothing to eat this morning.    Patient also reports she started her menstrual cycle today. She reports a hx of severe abdominal crampint with her cycles. She reports that she believes she has hx of endometrosis but has never been diagnosed.     With the exception of the above, there are no aggravating or alleviating factors.    Past Medical History:   Diagnosis Date   ??? GERD (gastroesophageal reflux disease)        History reviewed. No pertinent surgical history.    Virgin Zellers  reports that she has never smoked. She has never used smokeless tobacco. She reports that she does not drink alcohol or use drugs.    Patient's Medications   New Prescriptions    No medications on file   Previous Medications    ONDANSETRON (ZOFRAN) 4 MG TABLET    Take 1 tablet (4 mg total) by mouth every 8 hours  as needed for Nausea.   Modified Medications    No medications on file   Discontinued Medications    No medications on file       Allergies:   Allergies as of 11/26/2015   ??? (No Known Allergies)       PMH: Nursing notes reviewed   PSH: Nursing notes reviewed   FH: Nursing notes reviewed   MEDS: Nursing notes and chart reviewed         Review of Systems     Review of Systems   Constitutional: Positive for diaphoresis. Negative for chills and fever.   HENT: Negative for congestion.    Eyes: Negative for blurred vision and redness.   Respiratory: Negative for shortness of breath.    Cardiovascular: Negative for chest pain and leg swelling.   Gastrointestinal: Positive for abdominal pain and nausea. Negative for vomiting.   Genitourinary: Negative for dysuria.   Musculoskeletal: Negative for myalgias.   Skin: Negative for rash.   Neurological: Positive for dizziness, sensory change, seizures and loss of consciousness. Negative for tingling, focal weakness and headaches.   Psychiatric/Behavioral: Negative for hallucinations.         Physical Exam     Vitals:    11/26/15 1003 11/26/15 1102   BP: 112/71 110/70   BP Location: Right arm Right arm   Patient Position: Sitting Lying  Pulse: 60 65   Resp: 10 21   Temp: 98 ??F (36.7 ??C)    TempSrc: Oral    SpO2: 100% 100%       Physical Exam   Constitutional: She is oriented to person, place, and time and well-developed, well-nourished, and in no distress.   HENT:   Head: Normocephalic and atraumatic.   Eyes: Conjunctivae and EOM are normal. Pupils are equal, round, and reactive to light.   Neck: Normal range of motion. Neck supple.   Cardiovascular: Normal rate, regular rhythm and normal heart sounds.    Pulmonary/Chest: Effort normal and breath sounds normal. No respiratory distress. She has no wheezes. She has no rales.   Abdominal: Soft. Bowel sounds are normal. She exhibits no distension. There is no tenderness. There is no rebound and no guarding.   Musculoskeletal: Normal  range of motion. She exhibits no edema or deformity.   Neurological: She is alert and oriented to person, place, and time. She has normal sensation, normal strength and intact cranial nerves. She shows no pronator drift. Gait normal. Coordination normal. GCS score is 15.   Skin: Skin is warm and dry.   Psychiatric: Mood and affect normal.   Nursing note and vitals reviewed.            Diagnostic Studies     Labs: The attending and I have reviewed laboratory findings.  Labs were reviewed and and significant values identified.    Please see electronic medical record for any tests performed in the ED     Labs Reviewed   BASIC METABOLIC PANEL   CBC   DIFFERENTIAL   PREGNANCY QUALITATIVE, SERUM, [facility] ONLY   TSH       EKG:  Seen and Interpreted by myself and the attending physician, Dr. Ellis Savage    Indication: Syncope  Finding: Normal sinus rhythm with sinus arrhythmia. No evidence for acute ischemia or ST segment changes.  Ventricular Rate: 74  PR: Interval: 156  QRS Duration: 70  QT: 372  QTc: 412  P-R-T axes: 86-57-84      ED Course and MDM     Vital signs, medical history, social history, allergies and nursing notes reviewed.    Vitals:  BP 110/70 (BP Location: Right arm, Patient Position: Lying)    Pulse 65    Temp 98 ??F (36.7 ??C) (Oral)    Resp 21    LMP 11/26/2015 (Exact Date)    SpO2 100%     Briefly this is a is a 20 y.o. female who presents to the emergency department after syncopal episode. Patient reports she was in class when she developed lightheadedness, diaphoresis, nausea. Bystanders reported that patient had seizure. On EMS arrival, patient's BG was 37. After glucagon x 2, BG was 97. Patient reports feeling improved, asymptomatic after this. On exam, she is hemodynamically stable with normal vitals. Non-focal neurologic exam. Please see HPI and physical for further details. Ddx includes vasovagal, less likely cardiac given age and no cp/sob, anemia, dehydration, hypoglycemia. Will obtain EKG, orthostatics,  CBC, BMP, TSH, HCG.        The patient was seen and evaluated by the attending physician Dr. Ellis Savage who agreed with the assessment and plan.  The patient and / or the family were informed of the results of any tests, a time was given to answer questions, a plan was proposed and they agreed with plan.       At this time I am going off-service and will be  signing out care of this patient to my colleague Donnie Coffin, NP for further care. My colleague's responsibilities will include:  -follow-up lab results  -disposition pending results    Medications administered in the ED:    Medications   sodium chloride 0.9 % 1,000 mL IV fluid (1,000 mLs Intravenous New Bag 11/26/15 1059)       Impression     No diagnosis found. - pending    Plan     Disposition - pending           Hipolito Bayley, [place]  11/26/15 1225

## 2015-11-26 NOTE — ED Notes (Signed)
Patient expressed verbal understanding in dc instructions including home care if sugar drops, monitoring and f/u care appointment.

## 2015-11-26 NOTE — ED Notes (Signed)
Patient given cranberry juice to drink

## 2015-11-26 NOTE — Unmapped (Signed)
If you have any problems or repeat episodes of low blood sugar before the 2 week monitor is placed, you can call [phone] and ask for the Endocrine Fellow to be paged.  Remove call block from your phone as they will not be able to get through otherwise.  They should contact you to have a continuous blood glucose monitor placed.    U will be given a prescription for a blood glucose monitor.  You should check your blood sugar if you feel onset of symptoms of low blood sugar.  Record these readings to show the endocrinologist.  If you have a reading less than 80, you should eat or drink something containing sugar.

## 2015-11-26 NOTE — ED Provider Notes (Signed)
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 I resumed care of this patient from Pixie Casino PA; please see their dictation for complete history and physical exam. At time of patient turnover, the patient was pending labs, tertiary eval, MDM and Endocrinology consult given her frequent episodes of hypoglycemia and today's seizure activity secondary to hypoglycemia at 37.     Blood pressure 110/70, pulse 65, temperature 98 F (36.7 C), temperature source Oral, resp. rate 21, last menstrual period 11/26/2015, SpO2 100 %.    RESULTS:  Labs Reviewed   BASIC METABOLIC PANEL - Abnormal; Notable for the following:        Result Value    Glucose 167 (*)     All other components within normal limits    Narrative:     As of 04/02/2014 the estimated GFR is calculated from serum creatinine using the Chronic Kidney Disease  Epidemiology Collaboration (CKD-EPI) equation in patients 18 years and older.  The reference range is   >60 mL/min/1.32m2.  eGFR values greater than 90 will be reported as >74mL/min/1.73m2.  Reference: Cherie Dark AS, Suella [facility], Stefano Gaul AF, 3rd, Feldman HI, et. al.  A new equation to estimate glomerular filtration rate.  Ann Intern Med. 2009:150(9):604-12   DIFFERENTIAL - Abnormal; Notable for the following:     Lymphocytes Relative 14.2 (*)     Lymphocytes Absolute 795 (*)     All other components within normal limits   CBC   PREGNANCY QUALITATIVE, SERUM, [facility] ONLY   TSH   HEMOGLOBIN A1C     Clinical Impression: hypoglycemia    Disposition: D/C home with blood glucose monitoring and 2 week follow up with Endocrinology Fellow.       Jorge Ny, CNP  11/26/15 1618       Jorge Ny, CNP  11/26/15 [phone]

## 2015-12-10 NOTE — Telephone Encounter (Signed)
Patient calling. Patient was seen in [facility] ED by Dr Noel Gerold . Referral states Dr Noel Gerold wanted to have  2 week monitoring for blood sugar set up. Please contact patient with appt and monitoring. Patient can be reached at either verified number above.

## 2015-12-16 NOTE — Telephone Encounter (Signed)
Pt called call center back today as no one has called her to have the Libra Pro put on so I scheduled for tomorrow and sent message to [facility].  Char

## 2015-12-17 ENCOUNTER — Ambulatory Visit: Admit: 2015-12-17 | Discharge: 2015-12-17 | Payer: PRIVATE HEALTH INSURANCE

## 2015-12-17 DIAGNOSIS — E162 Hypoglycemia, unspecified: Secondary | ICD-10-CM

## 2015-12-17 NOTE — Unmapped (Signed)
As the on-site supervising physician, I agree with the recommendations and plan listed below.  Ellamae Sia MD

## 2015-12-17 NOTE — Unmapped (Signed)
Attached FreeStyle Libre Pro Flash Glucose Monitoring System to back of the patient's right upper arm.  Sensor lot #: [id number], SN #: [id number], Exp: 03/03/2016.  Patient tolerated well.  Instruction given on data collection while wearing the [facility], reinforcing when to test BG and to write down her symptoms and her food.  Pt is to return for scanning and removal on 12/31/15 and to return log sheets.  Instructed patient to call with any questions.    Shane Crutch, RN, CDE

## 2015-12-24 ENCOUNTER — Ambulatory Visit: Admit: 2015-12-24 | Discharge: 2015-12-24 | Payer: PRIVATE HEALTH INSURANCE | Attending: "Endocrinology

## 2015-12-24 DIAGNOSIS — E162 Hypoglycemia, unspecified: Secondary | ICD-10-CM

## 2015-12-24 NOTE — Unmapped (Signed)
The patient's sensor came off yesterday.  She brought the sensor with her and I was able to scan it for data.  Attached FreeStyle Libre Pro Flash Glucose Monitoring System to back of the patient's right upper arm.  Sensor lot #: [id number], SN #: [id number], Exp: 03/03/2016.  Patient tolerated well.  Instruction given on data collection while wearing the [employer], reinforcing when to test BG.  Pt is to return for removal on 12/31/15 and to return log sheets.  Instructed patient to call with any questions.    Shane Crutch, RN, CDE

## 2015-12-24 NOTE — Unmapped (Signed)
I have reviewed the case and details presented by Maggie Greene. I agree with the assessment and plan as documented.    Shamieka Gullo P Ania Levay MD  Assistant Professor  Division of Endocrinology

## 2015-12-31 ENCOUNTER — Ambulatory Visit: Admit: 2015-12-31 | Discharge: 2015-12-31 | Payer: PRIVATE HEALTH INSURANCE

## 2015-12-31 DIAGNOSIS — E162 Hypoglycemia, unspecified: Secondary | ICD-10-CM

## 2015-12-31 NOTE — Unmapped (Signed)
As the on-site supervising physician, I agree with the recommendations and plan listed below.  I preliminarily reviewed the downloaded continuous glucose monitoring and it appears to confirm the preliminary history of more hypoglycemia in this patient without diabetes during the week after menses started than in the prior week.  Pt to have appointment scheduled with Tish Men MD, endocrinology fellow, to review the results and develop a plan.    Ellamae Sia MD

## 2015-12-31 NOTE — Unmapped (Signed)
FreeStyle Libre Pro Flash Glucose Monitoring System was successfully scanned for data and the sensor was removed without complication.  Patient returned completed log sheets.  Patient does not currently have a follow up appt scheduled.    Shane Crutch, RN, CDE

## 2016-01-12 NOTE — Telephone Encounter (Signed)
Attempted calling patient to confirm appt for 03/2016, per Dr. Eloise Harman

## 2016-02-17 ENCOUNTER — Ambulatory Visit: Admit: 2016-02-17 | Discharge: 2016-02-17 | Payer: PRIVATE HEALTH INSURANCE

## 2016-02-17 DIAGNOSIS — N809 Endometriosis, unspecified: Secondary | ICD-10-CM

## 2016-02-17 MED ORDER — ibuprofen (ADVIL,MOTRIN) 800 MG tablet
800 | ORAL_TABLET | Freq: Three times a day (TID) | ORAL | 2 refills | Status: AC | PRN
Start: 2016-02-17 — End: 2016-02-18

## 2016-02-17 MED ORDER — norgestimate-ethinyl estradiol (SPRINTEC, 28,) 0.25-35 mg-mcg per tablet
0.25-35 | ORAL_TABLET | Freq: Every day | ORAL | 11 refills | 30.00000 days | Status: AC
Start: 2016-02-17 — End: 2016-02-18

## 2016-02-17 NOTE — Unmapped (Signed)
Here for pain

## 2016-02-17 NOTE — Progress Notes (Signed)
No RN Exit - see MD documentation.  Rosalie Doctor, RN

## 2016-02-17 NOTE — Progress Notes (Signed)
Chief complaint: endometriosis    HPI:  Erica Esparza is a 21 y.o. G0 who presents for evaluation of above. Patient reports was diagnosed at age 52. Was in class with severe pain. Thought I was going to die. Had significant emesis. Cannot stay still, rolls around in pain. Has poor appetite secondary to pain.     Had previously been on OCPs for endometriosis ~18-19, but discontinued after moving. Was on for 6-8 months with some improvement. Was also taking high dose tylenol and ibuprofen during menses.    GynHx:  Denies history of paps  Denies history of STDs    Menstrual history:  Menstrual History  Age of Menarche: 47  Period Cycle (Days): 30  Period Duration (Days): 4-7  Period Pattern: Regular  Menstrual Flow: Heavy  Menstrual Control: Maxi pad  Menstrual Control Change Freq (Hours): 6  Dysmenorrhea: (!) Severe  Dysmenorrhea Symptoms: Cramping  Menopausal?: no   Aching, sharp, cramping, stabbing occasionally and twisting.    I have reviewed and updated the patient's past medical, surgical, family, social history, medications and allergies    O:  BP 122/75 (BP Location: Right arm, Patient Position: Sitting, BP Cuff Size: Regular)   Pulse 96   Temp 96.8 F (36 C) (Oral)   Ht 5' 3 (1.6 m)   Wt 100 lb 14.4 oz (45.8 kg)   LMP 01/27/2016   BMI 17.87 kg/m     Gen: NAD, A&O   HEENT: atraumatic, normocephalic   Chest: normal work of breathing   Abd: soft, thin, NT, ND  Pelvic: normal appearing external genitalia, urethral meatus, normal appearing vaginal mucosa, normal appearing visually closed cervix with scant discharge.  Bimanual: small, firm, mobile uterus in midline, anteverted, no appreciable adnexal masses or tenderness, no cervical motion tenderness.    A/P:  Endometriosis: based on symptoms. Will begin OCPs for medical management. Discussed use of timed NSAIDs.     ?DM: has been checking blood sugars. Reports well controlled with diet. Follows with PCP    Dispo: return in 2-3 months to discuss  symptoms on OCPs    Reinaldo Raddle, MD

## 2016-02-18 NOTE — Telephone Encounter (Signed)
Pt asking if Rx was sent in. Informed pt orders are pended to MD and are awaiting signature

## 2016-02-18 NOTE — Telephone Encounter (Signed)
Prescription sent to wrong pharmacy. Updated pharmacy in epic to [facility]. Please send new RX

## 2016-02-18 NOTE — Telephone Encounter (Signed)
Pt states she needs a form completed for school from Dr Noel Gerold. Also a statement on letterhead. ( Pt states she was in the ED on 10/25 and with Dr Noel Gerold on 11/15 due to a seizure) . Pt states fax will be coming to office from [facility].       Pt states letter needs to include that due to the seizure she was unable to complete the course and needs to be excused from the class to be reimbursed the fees.  Due to this, the pt is unable to register for classes this semester.     Pt asking for lab results from that visit.  Pls advise.  Pt phone [phone]

## 2016-02-19 MED ORDER — norgestimate-ethinyl estradiol (SPRINTEC, 28,) 0.25-35 mg-mcg per tablet
0.25-35 | ORAL_TABLET | Freq: Every day | ORAL | 11 refills | 30.00000 days | Status: AC
Start: 2016-02-19 — End: ?

## 2016-02-19 MED ORDER — ibuprofen (ADVIL,MOTRIN) 800 MG tablet
800 | ORAL_TABLET | Freq: Three times a day (TID) | ORAL | 2 refills | Status: AC | PRN
Start: 2016-02-19 — End: ?

## 2016-02-19 NOTE — Telephone Encounter (Signed)
Attempted to advise patient of prescription being sent to correct pharmacy. Number on file does not have voicemail set up. If patient calls back please advise.

## 2016-02-20 NOTE — Telephone Encounter (Signed)
As of today have not received form,

## 2016-02-20 NOTE — Telephone Encounter (Signed)
Informed pt Rx sent to correct pharmacy. Pt states she was told to inform the office if she is unable to obtain her Rx. Pt states she currently lost her health insurance. Not sure when she will be able to obtain coverage. Informed pt to check with [facility] to verify cost of medication. Advised pt if too expensive, to call office and we can send Rx for Sprintec to [facility] as it is on the $9 list. Pt verbalized understanding.

## 2016-02-27 NOTE — Unmapped (Signed)
Spoke to patient, she will send form that needs to be filled out.

## 2016-02-27 NOTE — Unmapped (Signed)
Pt requesting letter for school - please contact [phone] to obtain more info from pt.  Asking for office e-mail address to contact Dr. Noel Gerold.

## 2016-03-12 ENCOUNTER — Ambulatory Visit: Payer: PRIVATE HEALTH INSURANCE

## 2016-03-18 NOTE — Unmapped (Signed)
Pt requesting letter for school - please contact [phone] to obtain more info from pt. Can you please refax this letter, college told the patient they were experiencing trouble with their fax machine. Please follow up.

## 2016-03-19 NOTE — Unmapped (Signed)
Patient is requesting to    [   ] Speak to the Nurse :    [   ] Schedule a procedure:    [   ] Status of paper work:    [   ] Labs to be entered into [place]    [   ] Request sooner appointment with Dr.     [   ] Medication question    [   ] Patient is returning call     [   ] Inform the Provider:    [   ] Schedule a New Patient Appointment with the following Provider:     [ XXX  ] Other:Pt following up on prev msg. Says she would like a letter stating medical condition documented on 03/03/16 sent to:    Olin Hauser   [address] [facility] [address]   [address]   [place], [place] [address]    Would also like a call or email when mailed.

## 2016-03-23 NOTE — Telephone Encounter (Signed)
Printed and mailed letter.  Erica Esparza was informed

## 2016-03-23 NOTE — Telephone Encounter (Signed)
Pt following up on request for letter.  Please phone [phone] to advise.

## 2016-03-23 NOTE — Telephone Encounter (Signed)
Pt states her menses is 5-6 days late.  She has taken several home pregnancy test and all were negative.  She has a history of Endometriosis and was given Sprintec to help regulate her cycles.  She is asking what should she do?

## 2016-03-25 NOTE — Telephone Encounter (Signed)
Returned patient call. No answer and voicemailbox not set up yet.

## 2016-03-26 NOTE — Telephone Encounter (Signed)
See phone message dated 03/25/16 per Dr. Donnalee Curry.

## 2016-03-31 NOTE — Telephone Encounter (Signed)
Placed call to patient to follow-up from phone call on 03/23/16. Pt states she did start her period after calling office. She started Sprintec on 1st day of period as instructed by Dr. Donnalee Curry. Encouraged pt to call office if she has any other problems or concerns. Pt states understanding.

## 2017-02-21 ENCOUNTER — Inpatient Hospital Stay
Admit: 2017-02-21 | Discharge: 2017-02-21 | Disposition: A | Discharge status: Home / Self Care | Payer: PRIVATE HEALTH INSURANCE

## 2017-02-21 DIAGNOSIS — O26899 Other specified pregnancy related conditions, unspecified trimester: Secondary | ICD-10-CM

## 2017-02-21 LAB — URINALYSIS-MACROSCOPIC W/REFLEX TO MICROSCOPIC
Bilirubin, UA: NEGATIVE
Blood, UA: NEGATIVE
Glucose, UA: NEGATIVE mg/dL
Leukocytes, UA: NEGATIVE
Nitrite, UA: NEGATIVE
Protein, UA: 30 mg/dL
Specific Gravity, UA: >=1.03 (ref 1.005–1.035)
Urobilinogen, UA: 0.2 EU/dL (ref 0.2–1.0)
pH, UA: 6 (ref 5.0–8.0)

## 2017-02-21 LAB — URINALYSIS, MICROSCOPIC
RBC, UA: 4 /HPF (ref 0–3)
Squam Epithel, UA: 4 /HPF (ref 0–5)
WBC, UA: 1 /HPF (ref 0–5)

## 2017-02-21 LAB — TRICHOMONAS SCREEN
Clue Cells, Wet Prep: ABSENT
Trich, Wet Prep: ABSENT
Yeast, Wet Prep: ABSENT

## 2017-02-21 LAB — HCG URINE, QUALITATIVE: Preg Test, Ur: POSITIVE

## 2017-02-21 LAB — CHLAMYDIA / GONORRHOEAE DNA SWAB
Chlamydia Trachomatis DNA Swab: NEGATIVE
Neisseria gonorrhoeae DNA Swab: NEGATIVE

## 2017-02-21 LAB — HIV 1+2 ANTIBODY/ANTIGEN WITH REFLEX: HIV 1+2 AB/AGN: NONREACTIVE

## 2017-02-21 LAB — HCG, QUANTITATIVE, PREGNANCY: hCG Quant: 90742.93 m[IU]/mL

## 2017-02-21 LAB — TREPONEMA PALLIDUM AB WITH REFLEX: Treponema Pallidum: NEGATIVE
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
## 2017-02-21 NOTE — Unmapped (Signed)
ED Attending Attestation Note    Date of service:  02/21/2017    This patient was seen by the resident physician.  I have seen and examined the patient, agree with the workup, evaluation, management and diagnosis. The care plan has been discussed and I concur.     My assessment reveals a 22 y.o. female with no significant abdominal tenderness to palpation, no rebound, no guarding.
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
## 2017-02-21 NOTE — Unmapped (Signed)
You are pregnant and it looks like your pregnancy is in your uterus. You should follow up with OBGYN to get pre-natal care. You should call their number and make an appointment.     [email or web address]    Please return with any concerns.

## 2017-02-25 DIAGNOSIS — R200 Anesthesia of skin: Secondary | ICD-10-CM

## 2017-02-25 NOTE — Unmapped (Signed)
[facility] ED Encounter      Reason for Visit: Hand Pain and Sore Throat      Patient History     HPI: Erica Esparza is an otherwise healthy 22 y.o. female who presents to the Emergency Department with 1-day complaint of right hand numbness and throat itching.  The patient reports that she came to the emergency department 2 days ago when an IV was placed in her right hand.  The patient reports that earlier this evening, she suddenly developed some hand numbness and swelling at the site of the IV placement in her right hand.  She denies any associated weakness or tingling.  She is right-hand dominant. 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  She endorses associated rhinorrhea but denies cough.  She endorses subjective fever yesterday.  She did not measure her temperature.    Other than this right hand numbness and throat itching, the patient reports she has been otherwise well.  She denies any pain.  She denies any difficulty breathing.  She denies recent loss of appetite, nausea, vomiting, or diarrhea.      Past Medical History:   Diagnosis Date   ??? GERD (gastroesophageal reflux disease)    ??? Hypoglycemia        History reviewed. No pertinent surgical history.    Social History  Erica Esparza  reports that she has never smoked. She has never used smokeless tobacco. She reports that she does not drink alcohol or use drugs.    Previous Medications    BLOOD SUGAR DIAGNOSTIC (GLUCOSE BLOOD) STRP    Check blood sugar each time you become symptomatic.    BLOOD-GLUCOSE METER (GLUCOSE MONITORING KIT) KIT    Use as instructed    IBUPROFEN (ADVIL,MOTRIN) 800 MG TABLET    Take 1 tablet (800 mg total) by mouth every 8 hours as needed for Pain.    NORGESTIMATE-ETHINYL ESTRADIOL (SPRINTEC, 28,) 0.25-35 MG-MCG PER TABLET    Take 1 tablet by mouth daily.       Allergies:    Allergies as of 02/25/2017   ??? (No Known Allergies)       Review of Systems     Review of Systems   Constitutional: Positive for fever (Subjective fever yesterday). Negative for chills and diaphoresis.   HENT: Negative for congestion, ear pain, sinus pain and sore throat.    Eyes: Negative for pain, discharge and redness.   Respiratory: Negative for cough and shortness of breath.    Cardiovascular: Negative for chest pain and leg swelling.   Gastrointestinal: Negative for abdominal pain, diarrhea, nausea and vomiting.   Genitourinary: Negative for flank pain.   Musculoskeletal: Negative for back pain, falls, joint pain, myalgias and neck pain.   Skin: Positive for itching. Negative for rash.   Neurological: Positive for sensory change. Negative for dizziness, focal weakness and headaches.   Psychiatric/Behavioral: Negative for substance abuse.       Physical Exam     ED Triage Vitals [02/25/17 2245]   Vital Signs Group      Temp 98.5 ??F (36.9 ??C)      Temp Source Oral      Heart Rate 74      Heart Rate Source Monitor      Resp 12      SpO2 100 %      BP 112/63      MAP (mmHg) 75  BP Location Left arm      BP Method Automatic      Patient Position Sitting   SpO2 100 %   O2 Device None (Room air)   Afebrile, all vital signs within normal limits     Physical Exam   Constitutional: She is well-developed, well-nourished, and in no distress.   HENT:   Head: Normocephalic and atraumatic.   Right Ear: Tympanic membrane, external ear and ear canal normal. No mastoid tenderness. Tympanic membrane is not perforated, not erythematous, not retracted and not bulging.   Left Ear: External ear normal. No mastoid tenderness. Tympanic membrane is not perforated, not erythematous, not retracted and not bulging.   Nose: Rhinorrhea present. No mucosal edema. No epistaxis. Right sinus exhibits no maxillary sinus tenderness and no frontal sinus tenderness. Left sinus exhibits no maxillary sinus tenderness and no frontal sinus  tenderness.   Mouth/Throat: Uvula is midline, oropharynx is clear and moist and mucous membranes are normal. No trismus in the jaw. No oropharyngeal exudate, posterior oropharyngeal edema or posterior oropharyngeal erythema.   Left-sided cerumen impaction, impeding visualization of the tympanic membrane   Eyes: EOM are normal. Right eye exhibits no discharge. Left eye exhibits no discharge.   Neck: Normal range of motion. No JVD present. No tracheal deviation present.   Cardiovascular: Normal rate, regular rhythm and normal heart sounds.    Pulmonary/Chest: Effort normal. No stridor. No respiratory distress.   Abdominal: Soft. She exhibits no distension. There is no tenderness.   Musculoskeletal: Normal range of motion. She exhibits no edema, tenderness or deformity.   patient denies pain with palpation of the right hand; overlying skin is intact with no lacerations, abrasions, contusions, or lesions; 5/5 grip strength bilaterally; sensation equal and intact bilaterally in the distribution of the median, radial, and ulnar nerves; +2 radial pulses bilaterally; full active range of motion of bilateral shoulders, elbows, and wrists; patient is able to make a fist, give thumbs up, opposition is intact   Neurological: She is alert. Gait normal. GCS score is 15.   Skin: Skin is warm and dry. No rash noted. She is not diaphoretic. No erythema. No pallor.   Nursing note and vitals reviewed.          Procedures:   None        Diagnostic Studies     Labs:  No tests were performed during this ED visit     Radiology:  No tests were performed during this ED visit    EKG:  No EKG Performed    Summary of medications given in ED     Medications - No data to display        ED Course and Medical-Decision-Making     Aigner Horseman presented to the Emergency Department with complaint of Hand Pain and Sore Throat      Physical exam found no abnormalities.  Specifically, the HEENT and musculoskeletal exam was benign.  Please see above  for full details, but briefly, there was no posterior oropharyngeal exudate, erythema, or edema.  The uvula was midline.  The patient was neurovascularly intact in bilateral upper extremities and was able to menstruate good and equal strength.  The overlying skin was intact.  She was found to have intact sensation in the distribution of the median, radial, and ulnar nerves.  She was afebrile and all vital signs were within normal limits.    For the patient's chief complaint of throat itching and right hand numbness, her examination  has been reassuring.  No labs or imaging were deemed necessary.    At this time, the patient's workup is complete and she is stable for discharge to home.  The patient's report of diminished sensation in her right hand is likely due to an IV that was placed 4 days ago.  There will likely spontaneously resolved over the next few days.  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  Her examination shows no evidence of exudate, erythema, or edema.  She is afebrile.  I find no objective evidence of strep and do not feel that further workup is necessary at this time.  The patient understands that should her symptoms persist or worsen, she should follow up with her primary care provider.  Even though she has a primary care provider listed, she told me she does not have a PCP.  For this reason, she was provided with a list of local health clinics for outpatient follow-up as needed.  Finally, as I was leaving the room, the patient admitted that she was recently found to be pregnant.  This was unplanned.  She wishes to terminate the pregnancy and requests help with a tablet that can help her with this.  I explain to the patient that we are unfortunately unable to provide abortions in the emergency department.  I recommended that [facility] with [facility] and she was given a list of local clinics for  outpatient follow-up.      The patient is reassured after her visit today. She understands her discharge instructions and has no further questions or complaints.         Chart review:  Chart review shows that the patient's last presentation to this Emergency department was on 02/21/2017 with complaint of abdominal pain.  At this visit, the patient was found to be pregnant.  A transvaginal ultrasound found a gestational sac and yolk sac.  STD checks were negative.    Bralee Feldt was seen by myself independently.          Impression     1. Hand numbness    2. Pregnancy as incidental finding          Plan     1) DISPOSITION: this patient was discharged home in good condition and return precautions were given  2) her PCP is RUBY HUANG, [place]  3) Please see AVS for return precautions and discharge instructions    The patient was informed of the results of any tests, a time was given to answer questions, and a plan was proposed. After our discussion, she agreed with the treatment plan and felt comfortable being discharged to home.     Tarri Fuller, PA-C       [place], [place]  02/26/17 0000

## 2017-02-26 ENCOUNTER — Inpatient Hospital Stay
Admit: 2017-02-26 | Discharge: 2017-02-26 | Disposition: A | Discharge status: Home / Self Care | Payer: PRIVATE HEALTH INSURANCE

## 2017-02-26 NOTE — Unmapped (Signed)
You were evaluated in the Emergency Department today for your right hand numbness and throat itching.    Your examination thus far has been reassuring and we feel that you are safe to be discharged to home.    Your symptoms will likely resolve over the next several days.  You told me that you do not have a primary care provider.  See below for a list of local health clinics who are accepting new patients.    You told me that you need help with an unplanned pregnancy.  I recommend making an appointment with [facility].  I have given you a list of local clinics.  Call [phone] at your earliest convenience to discuss your options.    If you develop worsening symptoms, or develop chest pain, trouble breathing, fever greater than 101F, uncontrollable vomiting, or other concerning symptoms, you should return to the emergency department.     HEALTH CENTERS THAT OFFER A SLIDING  FEE TO UNINSURED PATIENTS     Facility       Address-Location  Phone [facility]  [facility] [address]-  [address] -Shelter house [phone]   For Homeless population only   [facility]  [facility] [address]- [address]  [address] [place] [phone]     [phone]   [facility]   [address] [phone]   [facility]   ??? [place]  ??? [facility]   ??? [facility]  ??? [facility]                       [address]  [address]  [address] [employer]  [address]   [phone] Center for most locations         [employer] [facility] ,[address] [phone]   [facility] [place] [address] [phone]   [facility] [place]   [address]  [address] [phone]    [phone]      [facility]  [place] [address]  [address] [place] [phone]    [phone]   [facility]  [facility]  [facility]  [facility]  [facility]  [facility]  [facility]  [facility]  [facility] [address] [facility]  [address] [facility]  [address]   [address]  [address]   [address] [place]-  [phone] [facility] [address]   [address] [place]  [address] [phone]  [phone]  [phone]   [phone]  [phone]  [phone]  [phone]  [phone]   [phone]   [facility]  ??? [facility]  ??? [facility]   [address]  [address]. Call Center for both locations    [phone]       [facility] [address] [phone]   [facility]  [address] [phone]   [facility] [address]  [address]  [phone]

## 2017-03-19 ENCOUNTER — Inpatient Hospital Stay: Admit: 2017-03-19 | Discharge: 2017-03-19 | Disposition: A | Discharge status: Home / Self Care

## 2017-03-19 DIAGNOSIS — K529 Noninfective gastroenteritis and colitis, unspecified: Secondary | ICD-10-CM

## 2017-03-19 LAB — BASIC METABOLIC PANEL
Anion Gap: 8 mmol/L (ref 3–16)
Anion Gap: 10 mmol/L (ref 3–16)
BUN: 9 mg/dL (ref 7–25)
BUN: 8 mg/dL (ref 7–25)
CO2: 26 mmol/L (ref 21–33)
CO2: 23 mmol/L (ref 21–33)
Calcium: 9.2 mg/dL (ref 8.6–10.3)
Calcium: 8.4 mg/dL — ABNORMAL LOW (ref 8.6–10.3)
Chloride: 104 mmol/L (ref 98–110)
Chloride: 106 mmol/L (ref 98–110)
Creatinine: 0.79 mg/dL (ref 0.60–1.30)
Creatinine: 0.7 mg/dL (ref 0.60–1.30)
Glucose: 94 mg/dL (ref 70–100)
Glucose: 115 mg/dL — ABNORMAL HIGH (ref 70–100)
Osmolality, Calculated: 284 mOsm/kg (ref 278–305)
Osmolality, Calculated: 287 mosm/kg (ref 278–305)
Potassium: 3.4 mmol/L (ref 3.5–5.3)
Potassium: 3.6 mmol/L (ref 3.5–5.3)
Sodium: 138 mmol/L (ref 133–146)
Sodium: 139 mmol/L (ref 133–146)
eGFR AA CKD-EPI: >90 See note.
eGFR AA CKD-EPI: >90 See note.
eGFR NONAA CKD-EPI: >90 See note.
eGFR NONAA CKD-EPI: >90 See note.

## 2017-03-19 LAB — CBC
Hematocrit: 40.1 % (ref 35.0–45.0)
Hemoglobin: 14 g/dL (ref 11.7–15.5)
MCH: 31.9 pg (ref 27.0–33.0)
MCHC: 35 g/dL (ref 32.0–36.0)
MCV: 91 fL (ref 80.0–100.0)
MPV: 8.7 fL (ref 7.5–11.5)
Platelets: 265 10*3/uL (ref 140–400)
RBC: 4.4 10*6/uL (ref 3.80–5.10)
RDW: 12.9 % (ref 11.0–15.0)
WBC: 8.1 10*3/uL (ref 3.8–10.8)

## 2017-03-19 LAB — URINALYSIS-MACROSCOPIC W/REFLEX TO MICROSCOPIC
Bilirubin, UA: NEGATIVE
Glucose, UA: NEGATIVE mg/dL
Ketones, UA: NEGATIVE mg/dL
Leukocytes, UA: NEGATIVE
Nitrite, UA: NEGATIVE
Protein, UA: NEGATIVE mg/dL
Specific Gravity, UA: 1.02 (ref 1.005–1.035)
Urobilinogen, UA: 1 EU/dL (ref 0.2–1.0)
pH, UA: 7 (ref 5.0–8.0)

## 2017-03-19 LAB — DIFFERENTIAL
Basophils Absolute: 81 /uL (ref 0–200)
Basophils Relative: 1 % (ref 0.0–1.0)
Eosinophils Absolute: 308 /uL (ref 15–500)
Eosinophils Relative: 3.8 % (ref 0.0–8.0)
Lymphocytes Absolute: 1385 /uL (ref 850–3900)
Lymphocytes Relative: 17.1 % (ref 15.0–45.0)
Monocytes Absolute: 851 /uL (ref 200–950)
Monocytes Relative: 10.5 % (ref 0.0–12.0)
Neutrophils Absolute: 5476 /uL (ref 1500–7800)
Neutrophils Relative: 67.6 % (ref 40.0–80.0)
PLT Morphology: NORMAL
RBC Morphology: NORMAL

## 2017-03-19 LAB — URINALYSIS, MICROSCOPIC
RBC, UA: <1 /HPF (ref 0–3)
Squam Epithel, UA: 3 /HPF (ref 0–5)
WBC, UA: 1 /HPF (ref 0–5)

## 2017-03-19 LAB — HCG URINE, QUALITATIVE: Preg Test, Ur: POSITIVE

## 2017-03-19 LAB — HCG, QUANTITATIVE, PREGNANCY: hCG Quant: 2706.09 m[IU]/mL

## 2017-03-19 LAB — MAGNESIUM: Magnesium: 1.7 mg/dL (ref 1.5–2.5)

## 2017-03-19 MED ORDER — acetaminophen (TYLENOL) tablet 975 mg
325 | Freq: Once | ORAL | Status: AC
Start: 2017-03-19 — End: 2017-03-19
  Administered 2017-03-19: 06:00:00 975 mg via ORAL

## 2017-03-19 MED ORDER — ondansetron (ZOFRAN ODT) 4 MG disintegrating tablet
4 | ORAL_TABLET | Freq: Three times a day (TID) | ORAL | 0 refills | Status: AC | PRN
Start: 2017-03-19 — End: ?

## 2017-03-19 MED ORDER — potassium chloride (KLOR-CON M20) CR tablet 40 mEq
20 | Freq: Once | ORAL | Status: AC
Start: 2017-03-19 — End: 2017-03-19
  Administered 2017-03-19: 09:00:00 40 meq via ORAL

## 2017-03-19 MED ORDER — lactated Ringers 1,000 mL IV fluid
Freq: Once | INTRAVENOUS | Status: AC
Start: 2017-03-19 — End: 2017-03-19
  Administered 2017-03-19: 06:00:00 1000 mL via INTRAVENOUS

## 2017-03-19 MED ORDER — ondansetron (ZOFRAN) injection 4 mg
4 | Freq: Once | INTRAMUSCULAR | Status: AC
Start: 2017-03-19 — End: 2017-03-19
  Administered 2017-03-19: 06:00:00 4 mg via INTRAVENOUS

## 2017-03-19 MED FILL — ONDANSETRON HCL (PF) 4 MG/2 ML INJECTION SOLUTION: 4 4 mg/2 mL | INTRAMUSCULAR | Qty: 2

## 2017-03-19 MED FILL — KLOR-CON M20 MEQ TABLET,EXTENDED RELEASE: 20 20 mEq | ORAL | Qty: 2

## 2017-03-19 MED FILL — TYLENOL 325 MG TABLET: 325 325 mg | ORAL | Qty: 3

## 2017-03-19 NOTE — Unmapped (Addendum)
Patient presents to ER triage with c/o 5/10 headache, and n/v with multiple emesis since yesterday. She had elective abortion on 03/10/17, without complications.

## 2017-03-19 NOTE — Unmapped (Signed)
ED Attending Attestation Note    Date of service:  03/19/2017    This patient was seen by the resident physician.  I have seen and examined the patient, agree with the workup, evaluation, management and diagnosis. The care plan has been discussed and I concur.     My assessment reveals a 22 y.o. female presents complaining of nausea and vomiting.  Patient has a soft abdomen on exam.  Will check laboratory studies, treat symptomatically.

## 2017-03-19 NOTE — Unmapped (Signed)
[facility] EMERGENCY DEPARTMENT NOTE  Patient Name: Erica Esparza  Patient Age/Sex: 22 y.o.female  DOS: 03/19/2017 12:13 AM  PCP: Catalina [place]ger, [place] [phone]  Insurance: No Payor information available    Triage CC:   Emesis and Headache    HPI:   This is a 22 y.o. female who presents with nausea, vomiting which woke her up from sleep.  She reports that she went to bed completely normal and without symptoms.  She has no major medical issues.  They said she may have eaten something bad. Specifically, she states that she ate seafood from a refrigerator that wasn't working properly. She feels like diarrhea is coming but hasn't had any diarrhea yet.  She denies any fevers, hematochezia, melena, abdominal pain, hematemesis, dysuria, urinary frequency, urinary urgency, hematuria, rashes, chest pain, shortness of breath.  She does feel like she has developed a headache since this started, but reports the vomiting started before the headache.  The headache is frontal and mild.  Not associated with photophobia or neck stiffness.  She also has vaginal bleeding with discharge, which she reports is improving since she had an elective abortion on 03/10/16.    ROS:  Pertinent items are noted in HPI. All other systems reviewed and negative, except as stated in HPI.    Physical Exam:  Constitutional: Well-developed, well-nourished.  Head: Normocephalic, atraumatic.  Eyes: PERRL, EOMI, normal sclera.  Ears/Nose: No epistaxis, no otorrhea.  Neck: Supple, trachea midline, no JVD.  Cardiovascular: RRR, S1/S2, no M/R/G.  Pulmonary: Normal WOB, CTAB.  Gastrointestinal: Abdomen soft, NT/ND, no guarding or rigidity.  Musculoskeletal: Atraumatic, no peripheral edema.  Integumentary: Warm, well-perfused skin. No concerning rashes noted.  Neurologic: A&Ox4, strength and sensation symmetric and full throughout, cranial nerves III-XII grossly intact.   Psychiatric: Appropriate mood and affect.     Vitals:    03/19/17 0013   BP: 110/72   BP  Location: Left arm   Patient Position: Sitting   Pulse: 121   Resp: 20   Temp: 99 ??F (37.2 ??C)   TempSrc: Oral   SpO2: 100%      NOTE: Vital signs and MAR for certain SRU patients, including trauma patients, may not be immediately reflected in the electronic record. Please review paper SRU records for these patients.    EKG:  N/A    MDM & ED Course:  ED Course as of Mar 20 415   Sat Mar 19, 2017   0034 This is a 22 year old woman who presents to emergency department with nausea and vomiting. No abdominal pain, fevers, or blood in stools or vomit. No diarrhea yet; symptoms recently started. Benign abdominal exam. HR of 121 documented in triage was already down to 90's without intervention when I examined patient. She appears clinically euvolemic, has vomited once in the ED. Will give fluids, Tylenol, Zofran and obtain basic labs, including UA / HCG. Likely foodborne illness.   [MS]   [phone] On reassessment, the patient states that she feels completely better.  That her urine pregnancy test was positive, and this may be a residual positive from her previous pregnancy.  I ordered a quantitative hCG.  I spoke to the patient, and she assured me that she has not had any sex since her procedure.  [MS]   0416 HCG is 2.7k down from 91k and no abdominal pain. Overall reassuring. Will have patient follow-up with GYN to ensure return to baseline. I've prescribed Zofran for nausea. Strict return precautions given.  [MS]  ED Course User Index  [MS] Johnny Bridge, MD       ED Administered Medications:  Medications   lactated Ringers 1,000 mL IV fluid (0 mLs Intravenous Stopped 03/19/17 0304)   ondansetron (ZOFRAN) injection 4 mg (4 mg Intravenous Given 03/19/17 0111)   acetaminophen (TYLENOL) tablet 975 mg (975 mg Oral Given 03/19/17 0111)   potassium chloride (KLOR-CON M20) CR tablet 40 mEq (40 mEq Oral Given 03/19/17 0406)     Impression:  1. Gastroenteritis    2. Elevated serum hCG          Reviewed available internal  / external medical records and test results: YES  Reviewed RN documentation: YES  Reviewed referral paperwork: N/A   Certified medical interpreter used: N/A    The patient was evaluated by myself and the ED Attending Physician. All management and disposition plans were discussed and agreed upon.      Johnny Bridge, MD  Emergency Medicine, PGY-3  Phone: [phone]    This note was dictated using voice-recognition software, which occasionally construes inadvertent typographic errors.    ~~~~~~~~~~~~~~~~~~~~~~~~~~~~~~~~~~~~~~~~~~~~~~~~~~~~~~~~~~~~~~~~~~~~~~~~~  ~~~~~~~~~~~~~~~~~~~~~~~~~~~~~~~~~~~~~~~~~~~~~~~~~~~~~~~~~~~~~~~~~~~~~~~~~    Allergies:  Allergies as of 03/19/2017   ??? (No Known Allergies)     PMH:  Past Medical History:   Diagnosis Date   ??? GERD (gastroesophageal reflux disease)    ??? Hypoglycemia      PSH:  History reviewed. No pertinent surgical history.    Family Hx:  Family History   Problem Relation Age of Onset   ??? Diabetes Mother         gestational dm   ??? Breast Cancer Neg Hx    ??? Colon Cancer Neg Hx    ??? Clotting disorder Neg Hx    ??? Ovarian cancer Neg Hx    ??? Endometrial Cancer Neg Hx        Social Hx:   reports that she has never smoked. She has never used smokeless tobacco. She reports that she does not drink alcohol or use drugs.     Labs:  Please review the electronic medical record for laboratory record.    Imaging:  No orders to display            Johnny Bridge, MD  Resident  03/19/17 [phone]

## 2017-03-19 NOTE — Unmapped (Signed)
Pt given one prescription

## 2017-03-19 NOTE — Unmapped (Signed)
Please take Zofran as prescribed for your nausea. Stay well-hydrated with clear fluids so that your urine is light yellow.    Please plan to follow-up with your gynecologist within the next 1-2 weeks to ensure that your hormones return to normal and your vaginal bleeding is completely resolved.    If you have any new or worsening symptoms, please consider returning to the ER for evaluation.

## 2017-06-02 DIAGNOSIS — J029 Acute pharyngitis, unspecified: Secondary | ICD-10-CM

## 2017-06-02 NOTE — ED Triage Notes (Signed)
Pt to ED with c/o sore throat over last two months. Pt reports the pain has spread throughout her neck. Denies cough.

## 2017-06-02 NOTE — Unmapped (Signed)
Erica Esparza    Reason for Visit: Sore Throat      Patient History     HPI: Erica Esparza is a 22 y.o. female who presents to the emergency department for complaints of sore throat. Patient reports sore throat x 3 months. However, on further delineation of history, patient reports there is always something wrong with her throat. Upon clarification, her symptoms have really been present x 3 days, although she notes that she has had frequent sore throats over the past 3 months. Has tried Catering manager, hot beverages without relief of her symptoms. She has pain with swallowing. She has some myalgias in her neck and shoulder. Reports subjective fevers although she has not taken her temperature. Also with associated chills. Reports cough with sputum production. Reports nausea with some vomiting. Also reports abdominal cramping. No dysuria, hematuria, urgency, or frequency. LMP was last week. No vaginal discharge, bleeding, or spotting. Normal stools. Patient is sexually active. Does not engage in oral sex. No history of gonorrhea or chlamydia or other STIs.     Denies exacerbating or alleviating symptoms except those as noted above.        Past Medical History:   Diagnosis Date   ??? GERD (gastroesophageal reflux disease)    ??? Hypoglycemia        History reviewed. No pertinent surgical history.    Erica Esparza  reports that she has never smoked. She has never used smokeless tobacco. She reports that she does not drink alcohol or use drugs.    Previous Medications    BLOOD SUGAR DIAGNOSTIC (GLUCOSE BLOOD) STRP    Check blood sugar each time you become symptomatic.    BLOOD-GLUCOSE METER (GLUCOSE MONITORING KIT) KIT    Use as instructed    IBUPROFEN (ADVIL,MOTRIN) 800 MG TABLET    Take 1 tablet (800 mg total) by mouth every 8 hours as needed for Pain.    NORGESTIMATE-ETHINYL ESTRADIOL (SPRINTEC, 28,) 0.25-35 MG-MCG PER TABLET    Take 1 tablet by mouth daily.     ONDANSETRON (ZOFRAN ODT) 4 MG DISINTEGRATING TABLET    Take 1 tablet (4 mg total) by mouth every 8 hours as needed for Nausea.       Allergies:   Allergies as of 06/02/2017   ??? (No Known Allergies)       Review of Systems     ROS:  All systems were reviewed and are negative except as noted in the history of the present illness.     Physical Exam     ED Triage Vitals [06/02/17 2056]   Vital Signs Group      Temp 98.9 ??F (37.2 ??C)      Temp Source Oral      Heart Rate 104      Heart Rate Source Monitor      Resp 18      SpO2 98 %      BP 111/67      MAP (mmHg) 77      BP Location Right arm      BP Method Automatic      Patient Position Sitting   SpO2 98 %   O2 Device        General:  Well developed and well nourished Black or African American 22 y.o. in no acute distress    HEENT:  NC/AT PERRLA, EOMI, Throat with some erythema, no swelling, no evidence of PTA/RPA, no exudates, well hydrated     Neck: Supple  without meningismus. No lymphadenopathy.  Patient is not tender over the bones of the cervical spine. Patient with paraspinal muscular TTP, lateral rotation to R > L elicits some pain    Back: No point tenderness in cervical, thoracic, or lumbar spine.  Distal neurovascular was intact DTRs 2+ out of 4    Pulmonary:  Lungs clear and equal bilaterally without wheezes rales or rhonci    Cardiac:  Normal rate, RR without murmurs. No S3 or S4    Abdomen:Soft and nontender without rebound or guarding    Musculoskeletal:  Full range of all joints without pain or deformity    Vascular:  Pulses good in all 4 extremities     Skin:  Well hydrated without rashes or lesions    Neuro:  A & O x 4.  Cranial nerves II-XII grossly intact.  Motor and sensory exam normal    Psych: Normal mentation without evidence of hallucinations of mental disfunction      Diagnostic Studies     Labs:    Please see electronic medical record for any tests performed in the ED     Radiology:    No tests were performed during this ED visit    EKG:    No  EKG Performed    Emergency Department Procedures         ED Course and MDM     Erica Esparza is a 22 y.o. female who presented to the emergency department with Sore Throat    Diagnosis: Pharyngitis, unspecific etiology  Condition: Stable  Disposition: Home with return precautions, advised to take tylenol and ibuprofen     Patient was brought back to the ED examination room. History and physical exam was performed by myself. The case was discussed with my attending physician Dr. Lily Peer who  agrees with my assessment and plan. Patient was continuously monitored throughout their emergency department stay.     This is a 22 yo F without PMH presenting with sore throat and abdominal cramping.     Patient presents to the emergency department with recent malaise, cough, generalized body aches.    The patient has stable vital signs in the emergency department, appears well.    The patient does not have pain with neck flexion, lethargy, photophobia, this time I low suspicion for possible acute meningitis as a cause of the patient's symptoms.  The patient does not have pain with neck extension, throat swelling, hoarse voice, pooling of secretions, toxic appearance I therefore have a low suspicion for epiglottitis or retropharyngeal cellulitis/abscess.  The patient does not have uvular deviation on oral pharyngeal exam I low suspicion for possible peritonsillar abscess therefore no further imaging or procedures were performed as for this.      Due the patient's symptoms and physical exam findings I felt that it was not necessary to perform chest x-ray to further evaluate for possible pneumonia or other infection.  She requested a mono test which was obtained and was negative.     The patient has a CENTOR criteria of 0.      I treated the patient symptomatically in the emergency department with improvement of symptoms.  The patient was discharged without antibiotics as I believe this to be a viral illness.    The patient  complains of abdominal cramping in addition to her sore throat. She has no urinary symptoms and LMP was last week but requests a UA. UA obtained which was negative and UPT was negative as well. The  patient has a benign abdominal exam without TTP. She does report some vomiting after cough but otherwise has no n/v or diarrhea or anything symptoms.     At this time the patient appears stable and appropriate for discharge with tylenol and ibuprofen.     The patient's diagnosis/plan were discussed at bedside, the patient was given explicit return precautions, the patient was discharged from the ED in good condition.       Sinda Du, MD  Resident  06/03/17 [phone]

## 2017-06-02 NOTE — ED Provider Notes (Signed)
ED Attending Attestation Note    Date of service:  06/02/2017    This patient was seen by the resident physician.  I have seen and examined the patient, agree with the workup, evaluation, management and diagnosis. The care plan has been discussed and I concur.     My assessment reveals a 22 y.o. female he presents today complaining of a sore throat and some present for greater than one month.  She states that earlier today she also had a mild headache that has since spontaneously resolved.  No fevers or chills.  No difficulty breathing.  She also complains of some mild suprapubic cramping but denies dysuria urgency frequency vaginal bleeding or discharge.   Denies any recent trauma.  On exam she is awake alert oriented 4, his membranes moist, oropharynx is clear, no erythema exudate or asymmetry in the posterior oropharynx, trachea is midline, neck is supple, no tenderness of the cervical spine.  Lungs clear bilaterally, symmetric radial pulses, abdomen is soft with minimal suprapubic tenderness, no other focal tenderness no rebound or guarding.

## 2017-06-02 NOTE — Unmapped (Signed)
Bed: I12U  Expected date:   Expected time:   Means of arrival:   Comments:  Triage

## 2017-06-02 NOTE — Unmapped (Signed)
Bed: I11U  Expected date:   Expected time:   Means of arrival:   Comments:  Triage

## 2017-06-02 NOTE — Unmapped (Signed)
You have been diagnosed with an upper respiratory infection (URI), sometimes called a common cold. Most people improve within 1 week, but symptoms can last up to 2 weeks. A cough may last up to a month.  You can spread the virus to others by oral contact. This includes kissing, sharing a glass, coughing, or sneezing.    Colds are caused by viruses which infects the nose, throat, or passages in the lungs. Unfortunately antibiotics do not work on viruses and it is up to your body to fight them off itself.  Therefore it is important that you take care of your body by eating well, drinking plenty of water, and getting lots of sleep so that your body is strong to fight the infection.    You should take tylenol and ibuprofen for pain.     Taking care of yourself at home:  Drink plenty of fluids.  Water, tea or sports drinks are better than sodas, juice or coffee.  Eat healthy. Chicken soup or clear broths are healthy and easy on the stomach.  Get plenty of rest.  Take Mucinex or Robitussin for nasal congestion and/or cough.  Use cough drops for cough or sore throat  Take Ibuprofen or Tylenol for body aches or fevers.  Do not smoke while sick  It may help to breathe heated mist or steam (vaporizer or shower).    Call your doctor or come back to the emergency department if:   After a few days, you feel you are getting worse.  You develop worse shortness of breath or cough up brown or red sputum/mucus.   You develop difficulty swallowing or difficulty breathing through your throat or mouth.  You develop severe or persistent headache, ear pain, sinus pain, or chest pain.  You develop wheezing, a prolonged cough, cough up blood, or have a change in your usual mucus

## 2017-06-03 ENCOUNTER — Inpatient Hospital Stay
Admit: 2017-06-03 | Discharge: 2017-06-03 | Disposition: A | Discharge status: Home / Self Care | Payer: PRIVATE HEALTH INSURANCE

## 2017-06-03 LAB — URINALYSIS-MACROSCOPIC W/REFLEX TO MICROSCOPIC
Bilirubin, UA: NEGATIVE
Blood, UA: NEGATIVE
Glucose, UA: NEGATIVE mg/dL
Ketones, UA: NEGATIVE mg/dL
Leukocytes, UA: NEGATIVE
Nitrite, UA: NEGATIVE
Protein, UA: 100 mg/dL
Specific Gravity, UA: 1.02 (ref 1.005–1.035)
Urobilinogen, UA: 0.2 EU/dL (ref 0.2–1.0)
pH, UA: 7 (ref 5.0–8.0)

## 2017-06-03 LAB — HCG URINE, QUALITATIVE: Preg Test, Ur: NEGATIVE

## 2017-06-03 LAB — URINALYSIS, MICROSCOPIC
RBC, UA: 1 /HPF (ref 0–3)
Squam Epithel, UA: 7 /HPF — ABNORMAL HIGH (ref 0–5)
WBC, UA: 1 /HPF (ref 0–5)

## 2017-06-03 LAB — MONONUCLEOSIS SCREEN: Infectious Mono: NEGATIVE

## 2017-06-29 ENCOUNTER — Encounter: Payer: PRIVATE HEALTH INSURANCE | Attending: Family

## 2017-06-29 NOTE — Progress Notes (Deleted)
[facility] Progress Note    No chief complaint on file.      Subjective  Here today for the above: ***  ***  LMP: ***  BC: ***    Social:  Born and raised in ***.   Level of education, ***  Marital status: ***  Number of children: ***  Lives: ***homeless  Works: ***    Last seen in primary care, **** 09/01/15, [employer] [facility].   *** healthy baby, child, adolescent    Specialty/PMH:  ***  GYN, Dr Donnalee Curry, last saw 02/17/16, endometriosis, clinical dx. Dysmenorrhea, on CHC at one point which had helped. Resumed CHC.  ENDO, DM ***  Seizure, ***, low BG???  Preg, 03/19/17, ***    Testing:  Last MMG, ***   Last colonoscopy, ***  Last pap, ***      Labs today, ***    Prechart:  ED visit, acute.       ROS: ***  ROS    Social history:  She reports that she has never smoked. She has never used smokeless tobacco. She reports that she does not drink alcohol or use drugs.    Social History     Social History Narrative    No narrative on file         Medications: reviewed ***    Objective  There were no vitals filed for this visit.  Physical Exam  ***    Assessment & Plan  Erica Esparza is a 22 y.o. female here for evaluation.     ***  2 months PE  Follow-up  No Follow-up on file.    Clare Charon, CNP  [address], [address] [phone]    Thank you for coming in today!    ***- ***    ***- ***    Labs *** on first floor    See you back in *** for ***

## 2017-11-18 ENCOUNTER — Inpatient Hospital Stay: Admit: 2017-11-18 | Payer: PRIVATE HEALTH INSURANCE | Attending: Family

## 2017-11-18 ENCOUNTER — Ambulatory Visit: Admit: 2017-11-18 | Discharge: 2017-11-18 | Payer: PRIVATE HEALTH INSURANCE | Attending: Family

## 2017-11-18 DIAGNOSIS — M419 Scoliosis, unspecified: Secondary | ICD-10-CM

## 2017-11-18 DIAGNOSIS — M4185 Other forms of scoliosis, thoracolumbar region: Secondary | ICD-10-CM

## 2017-11-18 NOTE — Unmapped (Signed)
CC: Mid to lower back pain, Scoliosis    HPI: Kaysi Ourada is a 22 y.o.  female with a  history of back pain for 5  years. Injury: none Pain is described as sharp and throbbing and is occasional. The pain does not radiate . Back pain is more bothersome than leg pain. The patient's pain currently is rated a 2 and at it's worse a 7. Symptoms are relieved by rest and provoked by standing for more than 4 hours. Patient denies urinary and bowel retention and incontinence. Patient denies numbness & tingling.       ROS: All others negative except for as stated in HPI    Past Medical History:   Diagnosis Date   ??? GERD (gastroesophageal reflux disease)    ??? Hypoglycemia        History reviewed. No pertinent surgical history.    Social History     Socioeconomic History   ??? Marital status: Single     Spouse name: Not on file   ??? Number of children: Not on file   ??? Years of education: Not on file   ??? Highest education level: Not on file   Occupational History   ??? Not on file   Social Needs   ??? Financial resource strain: Not on file   ??? Food insecurity:     Worry: Not on file     Inability: Not on file   ??? Transportation needs:     Medical: Not on file     Non-medical: Not on file   Tobacco Use   ??? Smoking status: Never Smoker   ??? Smokeless tobacco: Never Used   Substance and Sexual Activity   ??? Alcohol use: No   ??? Drug use: No   ??? Sexual activity: Yes     Partners: Male     Birth control/protection: None   Lifestyle   ??? Physical activity:     Days per week: Not on file     Minutes per session: Not on file   ??? Stress: Not on file   Relationships   ??? Social connections:     Talks on phone: Not on file     Gets together: Not on file     Attends religious service: Not on file     Active member of club or organization: Not on file     Attends meetings of clubs or organizations: Not on file     Relationship status: Not on file   ??? Intimate partner violence:     Fear of current or ex partner: Not on file     Emotionally abused: Not  on file     Physically abused: Not on file     Forced sexual activity: Not on file   Other Topics Concern   ??? Caffeine Use Not Asked   ??? Occupational Exposure Not Asked   ??? Exercise Not Asked   ??? Seat Belt Not Asked   Social History Narrative   ??? Not on file       No Known Allergies    Current Outpatient Medications on File Prior to Visit   Medication Sig Dispense Refill   ??? blood sugar diagnostic (GLUCOSE BLOOD) Strp Check blood sugar each time you become symptomatic. 50 each 0   ??? blood-glucose meter (GLUCOSE MONITORING KIT) kit Use as instructed 1 kit 0   ??? ibuprofen (ADVIL,MOTRIN) 800 MG tablet Take 1 tablet (800 mg total) by mouth every 8 hours as  needed for Pain. 30 tablet 2   ??? norgestimate-ethinyl estradiol (SPRINTEC, 28,) 0.25-35 mg-mcg per tablet Take 1 tablet by mouth daily. 28 tablet 11   ??? ondansetron (ZOFRAN ODT) 4 MG disintegrating tablet Take 1 tablet (4 mg total) by mouth every 8 hours as needed for Nausea. 20 tablet 0     No current facility-administered medications on file prior to visit.          Physical Exam:     Vitals:    11/18/17 0909   Weight: 101 lb 9.6 oz (46.1 kg)   Height: 5' 3 (1.6 m)       General: No distress, Alert & Oriented X 4 and Well-nourished  Resp: Breathing unlabored  Spine: lower thoracic and upper lumbar tenderness  LLE: No deformity, Non-tender, DP pulse 2+ and Sens intact  RLE: No deformity, Non-tender, DP pulse 2+ and Sens intact  Back Exam     Tenderness   The patient is experiencing tenderness in the lumbar and thoracic.    Range of Motion   The patient has normal back ROM.    Muscle Strength   The patient has normal back strength.    Tests   Straight leg raise right: negative  Straight leg raise left: negative    Reflexes   Patellar: normal  Achilles: normal    Other   Sensation: normal  Gait: normal   Erythema: no back redness  Scars: absent    Comments:  Scoliosis noted with flexion            Imaging reviewed:  XRAY  Right pelvis higher than left  Balanced Curve  - 16 degrees T7-L3, 14 degrees L1-L5    Assessment  21 y.o. female with Scoliosis    Plan  1. Left heel lift  2. PT  Quickdraw BRACE GIVEN TODAY IN CLINIC TO:   A) REDUCE PAIN BY RESTRICTING MOBILITY OF THE TRUNK   B) FACILITATE HEALING FOLLOWING AN INJURY TO THE SPINE OR RELATED SOF TISSUES   C) FACILITATE HEALING FOLLOWING A SURGICAL PROCEDURE ON THE SPINE OR RELATED SOFT TISSUE   D) OTHER WISE SUPPORT WEAK SPINAL MUSCLES AND/OR DEFORMED SPINE.   4. RTC 1 year for repeat X-ray       Answered all of the patient???s questions to her satisfaction and understanding, and she is in agreement with the plan.    Maisie Fus, CNP

## 2017-11-18 NOTE — Unmapped (Signed)
Right heel lift was applied per the doctor's instruction.   Patient tolerated this well with no complications.  Patient was given instructions regarding showering/bathing, brace usage.

## 2017-12-01 ENCOUNTER — Encounter: Admit: 2017-12-01 | Discharge: 2017-12-01 | Payer: PRIVATE HEALTH INSURANCE

## 2017-12-01 DIAGNOSIS — M419 Scoliosis, unspecified: Secondary | ICD-10-CM

## 2017-12-01 NOTE — Unmapped (Signed)
Physical Therapy Initial Evaluation and Plan of Care  Name: Erica Esparza     Date of Birth: 1995-08-24      MRN: [id number]    Date of evaluation: 12/01/2017  Referring Physician: Maisie Fus, CNP  [phone] [facility].  Orthopaedics  [place], [place] [address] Phone: [phone] Fax: [phone]  Specific Order: Evaluate and Treat   Date of Onset: Pain for most of her life  Primary Medical Diagnosis:   1. Other form of scoliosis of thoracolumbar spine       Insurance: Payor: AETNA / Plan: AET[employer] OPEN ACCESS SLCT / Product Type: HMO /     Subjective/History:   History of Current Problem/Reason for Referral (: Erica Esparza is a 22 y.o. female who presents today with chief complaint of lumbar and thoracic pain with specific onset during prolonged standing  Cause of Symptoms: no known injury  PMH/PSH:   Past Medical History:   Diagnosis Date   ??? GERD (gastroesophageal reflux disease)    ??? Hypoglycemia    , No past surgical history on file.  Prior Back surgery:   none  Medications: She has a current medication list which includes the following prescription(s): blood sugar diagnostic, blood-glucose meter, ibuprofen, norgestimate-ethinyl estradiol, and ondansetron.   Effectiveness for pain control:states she does not take meds for pain as a rule.  Living Environment: na   Work Status: Employed and working multiple jobs and going to school.  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     Please rate these problems on a scale of 0 (UNABLE TO PERFORM) and 10 (ABLE AT SAME LEVEL AS BEFORE INJURY/CONDITION)    Task/ Activity 0-10 rating    1.  Prolonged standing for more than 4 hours 5   2.    3.    Average of Scores      Objective:     Mental Status: alert, appears stated age and cooperative  Learning Assessment:   [x]  Patient is able to communicate with therapist and verbalize understanding of directions/instructions   []  Patient is unable to communicate with therapist and/or verbalize understanding of directions/instructions due to the following barriers to learning:   []   Reading  [] Language  [] Visual  [] Hearing  []  Other:  Is an interpreter required? [] Yes [x]  No  Primary Language:     Pain: Lumbar    Level (0-10): 2/10 at present; 10/10 at worst in past month; 0/10 at best   Location: lumbar and lower thoracic   Quality: constant and aching      Additional Comments: largely central spinal pain  Pain: Distal/LE:  Denies radicular pain     Relieved By: rest   Exacerbated By: standing x multiple minutes    Posture:  [] WNL      [] Weight Shifted  []  Right []  Left    [] Forward Head    [x]  T Kyphosis []  Increased [x]  Decreased    [x]  Lumbar Lordosis [x]  Increased []  Decreased    []  Elevated landmarks []  Right []  Left    [x]  Scoliosis Convex  [x]  Right []  Left    Gait:    [  x]WNL []  Antalgic       Trunk Range of Motion  Flexion:  45 pain  Extension:  30 pain  Lat rot Standing:R:30  L 30  LatRot Sitting:    R 45  L 45 pain       LE Range of Motion: within functional limits or tested as follows:  Patient reports that by nature she is very flexible.  Patient found to be with excessive hip rotation mobility right and left.  Hip ER 60 and IR 50 non painful  Very flexible hamstrings with SLR 90 degrees bil    LE   Strength: within functional limits or tested as follows:  Hip flex:L 5/5   R: 5/5  Hip abd:L 5/5 R: 5/5  Hip ext:L: 5/5 R: 5/5  Kn ext: L: 5/5 R: 5/5  Kn flex:L: 5/5 R: 5/5  An DF: L: 5/5  R: 5/5    Abdominal engagement:  Good recruitment of TrA  Paraspinals:  Right upper lumbar very tight.  Suspect over activation of QL right  Reduced timing with  gluteal to paraspinal recruitment.      Sensation:  WFL    Palpation: TTp 3/4 T8 to L5 centrally.  L5/S1 junction very tender.  Tenderness and increased tone right lumbar and right lower thoracic   Specialized Tests:  AP pressures tender and moderately stiff to aforementioned areas  Additional Assessment Information: Patient actively lifting weights.  Demonstrated squat, assessed for form observed to butt wink suggesting limited L/P stability during the squat ;  Patient reports that some type of  Brace has been ordered for her curvature but she does not know the status of this.  Assessment:   Assessment/Problem List: Patient presents with decreased A/P ROM   decreased strength  dependent with HEP  patient lacking knowledge on posture and body mechanics/self care . Patient would benefit from skilled PT to address the aforementioned deficits. Patient/family received education on the purpose of therapy, participated in the development of the POC and verbalized understanding and agreement of POC, goals.    Rehabilitation Potential: Based on this therapist's assessment, Erica Esparza has good rehabilitation potential for the PT goals stated below  Plan Of Care:   Patient/Family Goals: By discharge, pt would like to experience less pain during times of prolonged standing.   Short Term Goals: time frame 1wks  initial status 12/01/2017   patient will perform home exercise program based on therapy recommendations independently to maintain benefits gained in physical therapy. initiated       Long Term Goals: time frame 4-6 wks    patient will improve abdominal, paraspinal and gluteal strength to 4+/5 or greater in order to control posture during ADL's and weight training*. Paraspinals:  Right upper lumbar very tight.  Suspect over activation of QL right  Reduced timing with gluteal to paraspinal recruitment.     {    patient will improve concavity sides of curvatures flexibility / ROM to (non specific) in order to allow  for return of functional mobility and return of functional mobility and recreational activity without restriction. Right lumbar, left thoracic   Patient will have a full understanding of posture and body mechanics, and this will be demonstrated by patient's ability to verbalize 2 principals that they have learned throughout the course of care. Patient needs further instruction with weight training specific form.  Patient needs further instruction for self care strategy on days where she has prolonged standing       Plan:  Treatment to Include: Therapeutic Exercise: 97110, Therapeutic Activities: 97530, Neuromuscular Re-education: [id number], Manual Therapy: 97140 and PT Low Complexity Eval: 16109       Patient/Family Agree to Treatment: Yes      Treatment Diagnosis (ICD-10 Code):      ICD-9-CM ICD-10-CM   Scoliosis of thoracolumbar spine, unspecified scoliosis type ??- Primary     737            Therapy Frequency/Duration: Patient to receive skilled PT services up to 2 times per week for up to 4 weeks or up to 8 visits starting from the first scheduled follow up visit within this plan of care.        Certification Period: 12/01/2017 - 90 days      Therapist Signature: Ocie Bob, PT  Date: 12/01/2017  Physician Certification   I certify that the above patient is under my care an requires the above services. These professional services are to be provided from an established plan, related to the diagnosis and reviewed by me every 90 days.     Additional comments/revisions:     Physician Name: Maisie Fus, CNP    Signature_______________________________________ Date: _______    Evaluation Code Matrix  History:  Number of personal factors and comorbidities (includes relevant medical complications, complicating behaviors/beliefs, communication issues, mentation, etc):  [] 0  97161        [] 1-2 97162               [] >=3 [id number]   Examination Elements: includes number of activity/ participation limitations, affected body  structure (s), and affected body functions (example, ROM, strength, coordination, tone )  [] 1-2 elements 97161             [] 3 elements 97162        [] =>4 elements 97163   Clinical Presentation  []  97161 Stable (unchanging, uncomplicated, predicted rate of recovery)    []  97162 Evolving (changing clinical characteristics (improving/regressing,)   [] 97163 Unstable (unpredictable characteristics,(fluctuating pain, tone, function, BP response, etc)   Evaluation Code ( select the lowest code issued on the above components)  []  Low Complexity 97161  []  Moderate Complexity 97162      []  High Complexity 97163                      PT DAILY TREATMENT NOTES  Diagnosis:   1. Other form of scoliosis of thoracolumbar spine             Referring Provider:Kline, Marchelle Folks, CNP    Insurance plan:   Payor/Plan Subscr DOB Sex Relation Sub. Ins. ID Effective Group Num   1. CALVARY, DIFRANCO [birth year] Female  [id number] 05/02/17 [id number]                                   [address]                         # of visits per insurance authorization: 60 per year  # of visits per POC: 8         Verified Signed POC:    []  YES  []   NO   Date:      Message to Referral Source for signature via InBasket/ EMR []        Fax []        Mail []   Date:    Routed to PCP (if other than referring) for signature via   EMR []  Fax []    Mail []   Date:    Was POC updated?   []   YES []   NO    Date:        If yes, Verified Signed POC:    []   YES  []   NO   Date:      ______________________________________________________________________  PRIMARY PT: Ocie Bob, PT         PT DAILY TREATMENT NOTE    Subjective:    Objective:  EVALUATION COMPLETED TODAY. SEE EVAL DOCS FOR DETAILS      Therapeutic exercises performed today are noted in the log below.  They have been modified to suit current functional status and instructed for proper form to protect joint surfaces and soft tissue while enhancing flexibility and strength.    Manual  techniques were used today as indicated to improve resting muscle tone and flexibility in muscle groups that impact pain perception and active motion.    Exercise Log:  VISIT 1 2 3 4    DATE 12/01/2017      Pain Report       PT Evaluation:        Therapeutic Exercise: 97110       Glute bridges 10      Abdominal Bracing 10      Brace marches 10      UE TB ext and rows       Lumbar Rocking 10      Single Knee to Chest 10      Manual Therapy: 97140                Patient Education:12/01/2017:  patient received education on the purpose of therapy, participated in the development of the POC and verbalized understanding and agreement of POC, goals, initial HEP.  Assessment:    Plan:      MINUTES of TREATMENT   Evaluation/ Re evaluation Low 25   Therapeutic Exercise 23   Therapeutic Activity    Gait Training    Manual Therapy    Iontophoresis    Ultrasound    Electrical Stimulation    Neuromuscular Rehab (balance training)    Ice/ Heat    Total Treatment Time 48

## 2017-12-19 NOTE — Unmapped (Signed)
Patient states that she was told someone would contact her about getting fitted for a brace but she still has not heard from anyone and asked that we look into it??    please advise    LOV 11/18/2017    Assessment  22 y.o. female with Scoliosis  ??  Plan  1. Left heel lift  2. PT  3. Quickdraw BRACE GIVEN TODAY IN CLINIC TO:              A) REDUCE PAIN BY RESTRICTING MOBILITY OF THE TRUNK              B) FACILITATE HEALING FOLLOWING AN INJURY TO THE SPINE OR RELATED SOF TISSUES              C) FACILITATE HEALING FOLLOWING A SURGICAL PROCEDURE ON THE SPINE OR RELATED SOFT TISSUE              D) OTHER WISE SUPPORT WEAK SPINAL MUSCLES AND/OR DEFORMED SPINE.   4. RTC 1 year for repeat X-ray

## 2017-12-19 NOTE — Telephone Encounter (Signed)
Patient verified that the best number to reach her in is [phone]

## 2018-01-09 ENCOUNTER — Encounter: Payer: PRIVATE HEALTH INSURANCE | Attending: Rehabilitative and Restorative Service Providers"

## 2020-02-08 DIAGNOSIS — Z719 Counseling, unspecified: Secondary | ICD-10-CM

## 2020-05-29 DIAGNOSIS — Z719 Counseling, unspecified: Secondary | ICD-10-CM

## 2022-05-13 DIAGNOSIS — H6123 Impacted cerumen, bilateral: Secondary | ICD-10-CM

## 2022-05-13 DIAGNOSIS — R059 Cough, unspecified: Secondary | ICD-10-CM | POA: Insufficient documentation

## 2022-05-13 LAB — INFLUENZA A/B & SARS-COV-2 PCR COMBO FOR RAPID RESPONSE LAB
Influenza A PCR, RRL: NOT DETECTED
Influenza B PCR, RRL: NOT DETECTED
SARS-CoV-2 PCR, RRL: NOT DETECTED

## 2022-05-14 ENCOUNTER — Emergency Department
Admission: EM | Admit: 2022-05-14 | Discharge: 2022-05-14 | Disposition: A | Discharge status: Home / Self Care | Payer: MEDICAID | Attending: Emergency Medicine | Admitting: Emergency Medicine

## 2022-05-14 DIAGNOSIS — H9190 Unspecified hearing loss, unspecified ear: Secondary | ICD-10-CM

## 2022-05-14 DIAGNOSIS — H938X3 Other specified disorders of ear, bilateral: Secondary | ICD-10-CM

## 2022-05-14 DIAGNOSIS — H6123 Impacted cerumen, bilateral: Secondary | ICD-10-CM

## 2022-05-14 MED ORDER — DOCUSATE SODIUM 250 MG OR CAPS
250.0000 mg | ORAL_CAPSULE | Freq: Once | ORAL | Status: AC
Start: 2022-05-14 — End: 2022-05-14
  Administered 2022-05-14: 250 mg via OTIC
  Filled 2022-05-14: qty 1

## 2022-05-14 MED ORDER — DOCUSATE SODIUM 250 MG OR CAPS
250.0000 mg | ORAL_CAPSULE | Freq: Every day | ORAL | Status: DC
Start: 2022-05-14 — End: 2022-05-14

## 2022-05-14 MED ORDER — PSEUDOEPHEDRINE HCL 30 MG OR TABS
60.0000 mg | ORAL_TABLET | Freq: Once | ORAL | Status: AC
Start: 2022-05-14 — End: 2022-05-14
  Administered 2022-05-14: 60 mg via ORAL
  Filled 2022-05-14: qty 2

## 2022-05-14 MED ORDER — DEBROX 6.5 % OT SOLN
4.0000 [drp] | Freq: Two times a day (BID) | OTIC | 0 refills | Status: AC
Start: 2022-05-14 — End: ?

## 2022-05-14 MED ORDER — PSEUDOEPHEDRINE HCL 30 MG OR TABS
60.0000 mg | ORAL_TABLET | Freq: Four times a day (QID) | ORAL | 0 refills | Status: AC | PRN
Start: 2022-05-14 — End: ?

## 2022-05-14 NOTE — ED Notes (Signed)
Bed: H  Expected date:   Expected time:   Means of arrival: Automobile  Comments:

## 2022-05-14 NOTE — ED Provider Notes (Signed)
ED Provider Note  Patricia Hines electronic medical record reviewed for pertinent medical history.     Patricia Hines DOB: 1995-12-31 PMD: No Pcp, Per Patient     Chief Complaint   Patient presents with    Ear Problem     Pt stated she started taking a new skin cream and noticed her ears were itching a lot. Pt further noticed ears being clogged for the last month and enhances minimal hearing loss.   Pt also enhances nasal congestion that started this week with a cough   Denies fever, chills, sore throat        HPI: Patricia Hines is a 27 year old female who is in previously good health presents for several months of decreased hearing and feeling like her ears are congested.  She also reports she has had nasal congestion.  She attributes this to a skin cream which she placed on her ears several months ago that caused significant itching.  She has not had any fevers and does not have pain.  She reports she has had some whitish discharge from her ears.  No sore throat.  She has not had this in the past.  No sinus pain.        External Data Sources (Select all that apply):      Pertinent Medical History:    PMHx: As above    No past surgical history on file.    No family history on file.    No current outpatient medications    Physical Exam  BP 103/63 (BP Location: Left arm, BP Patient Position: Supine)   Pulse 72   Temp 98 F (36.7 C)   Resp 16   Ht  (1.6 m)   Wt 58.1 kg (128 lb 1.4 oz)   LMP 05/13/2022   SpO2 100%   BMI 22.69 kg/m   Physical Exam  Constitutional:       Appearance: Normal appearance.   HENT:      Head: Normocephalic and atraumatic.      Ears:      Comments: Patient has bilateral cerumen with clear TMs and no evidence of otitis externa.  No pain with manipulation of pinna     Nose: Nose normal.   Eyes:      Extraocular Movements: Extraocular movements intact.   Cardiovascular:      Rate and Rhythm: Normal rate.      Heart sounds: Normal heart sounds.   Pulmonary:      Effort:  Pulmonary effort is normal.      Breath sounds: Normal breath sounds.   Abdominal:      General: Bowel sounds are normal.   Musculoskeletal:         General: Normal range of motion.      Cervical back: Normal range of motion and neck supple.   Skin:     General: Skin is warm.      Capillary Refill: Capillary refill takes less than 2 seconds.   Neurological:      General: No focal deficit present.      Mental Status: She is alert and oriented to person, place, and time. Mental status is at baseline.   Psychiatric:         Mood and Affect: Mood normal.           Orders/Medications    Orders Placed This Encounter   Procedures    Head/Neck Surgery (ENT) Clinic       Medications  docusate sodium (COLACE) capsule 250 mg (has no administration in time range)   docusate sodium (COLACE) capsule 250 mg (250 mg Both Ears Given 05/14/22 0330)   pseudoePHEDrine (SUDAFED) tablet 60 mg (60 mg Oral Given 05/14/22 0330)       Medical Decision Making/Assessment/Plan    This is a(n) 27 year old female who has no past medical history on file. and presents with bilateral muffled hearing and sensation that her ears are congested.  On exam the patient has significant wax in both ears.  We did irrigate patient's ears with Colace.  Subsequently rinse them with normal saline.  Following procedure the majority of the patient's wax and debris is removed from her ears and she reports improvement.  Both TMs appear fine before and after Colace.  We will discharge the patient with prescription for decongestants and follow up with the ENT        ED Course/Updates/Disposition  ED Clinical Impression as of 05/14/22 0741   Congestion of both ears   Bilateral impacted cerumen   Decreased hearing, unspecified laterality                      Data Reviewed:        Risk of Complications and/or Morbidity:  Risk - Drugs : Prescription drug management             Derenda Fennel, MD  05/14/22 [phone]

## 2023-02-09 ENCOUNTER — Ambulatory Visit
Admission: RE | Admit: 2023-02-09 | Discharge: 2023-02-10 | Disposition: A | Discharge status: Home / Self Care | Payer: MEDICAID | Attending: Emergency Medicine | Admitting: Emergency Medicine

## 2023-02-09 DIAGNOSIS — H9190 Unspecified hearing loss, unspecified ear: Secondary | ICD-10-CM | POA: Insufficient documentation

## 2023-02-09 DIAGNOSIS — H938X3 Other specified disorders of ear, bilateral: Secondary | ICD-10-CM | POA: Insufficient documentation

## 2023-02-09 DIAGNOSIS — L299 Pruritus, unspecified: Secondary | ICD-10-CM | POA: Insufficient documentation

## 2023-02-10 NOTE — Progress Notes (Signed)
 AUDIOGRAM  HISTORY  Patient was seen today for an audiologic evaluation referred by Dr. Kandace, Prentice Ned. Patient reported in 2023 she was using applying a skin care product to her face. She stated the product also got into her ears which resulted in significant itching. Since then she has noticed her ears itch frequently and she has had white debris from her ears. She also reported occasional bilateral tinnitus and frequent bilateral aural pressure/fullness. Patient denied otalgia, dizziness, history of ear surgery, and vertigo.    OTOSCOPY:  Otoscopy revealed cerumen with partial tympanic membrane visualization for the right ear and minimal cerumen with tympanic membrane fully visualized for the left ear.     AUDIOLOGIC TESTING  Pure-tone results for the RIGHT side revealed normal hearing sensitivity.  Pure-tone results for the LEFT side revealed normal hearing sensitivity.    Word recognition scores (WRS) were excellent(100%) for the right ear and excellent (100%) for the left ear.    IMMITTANCE  Tympanometry revealed normal tympanogram (Type A) for the RIGHT ear.   Tympanometry revealed normal tympanogram (Type A) for the LEFT ear.     SUMMARY  Results indicated normal hearing sensitivity bilaterally. Tympanometry revealed normal ear canal volume, middle ear pressure, and tympanic membrane mobility bilaterally. Results were reviewed with patient.  Recommend follow-up with Head and Neck Surgery for medical management and evaluation and repeat audiogram as medically indicated.     Audiogram viewable in [employer] tab.    Patricia Hines, Au.D., CCC-A  Senior Audiologist [facility] [phone]

## 2023-06-17 ENCOUNTER — Encounter (INDEPENDENT_AMBULATORY_CARE_PROVIDER_SITE_OTHER): Payer: Self-pay | Admitting: Nurse Practitioner

## 2023-06-17 ENCOUNTER — Ambulatory Visit (INDEPENDENT_AMBULATORY_CARE_PROVIDER_SITE_OTHER): Payer: MEDICAID | Admitting: Nurse Practitioner

## 2023-06-17 DIAGNOSIS — H6123 Impacted cerumen, bilateral: Secondary | ICD-10-CM

## 2023-06-17 DIAGNOSIS — H938X3 Other specified disorders of ear, bilateral: Secondary | ICD-10-CM

## 2023-06-17 NOTE — Progress Notes (Signed)
 [facility] Department of Otolaryngology/ Head and Neck Surgery New Patient Visit      Chief Complaint:   Patricia Hines is a 28 year old female patient who presents today for ear itching    History of Present Illness:    The patient states she has had bilateral ear itching for about a year. It initially started when she was applying a cream on her face. She noticed some of it was getting in her ear and she started having some ear fullness and dryness as well as itching. She went to the ED and was noted to have cerumen and attempted to clean out the ear but this caused discomfort so the patient left without getting her cerumen removed.    There are no active problems to display for this patient.    No past medical history on file.    No past surgical history on file.     Debrox Place 4 drops into both ears 2 times daily. Use in affected ear(s) 1 each 0    pseudoePHEDrine  Take 2 tablets (60 mg) by mouth every 6 hours as needed for Congestion. 15 tablet 0       No Known Allergies    none     Socioeconomic History    Marital status: Single   Tobacco Use    Smoking status: Never    Smokeless tobacco: Never       No family history on file.    Review Of Systems:  See above    PHYSICAL EXAMINATION:  There were no vitals taken for this visit.    General: Patient is well nourished, well hydrated, no acute distress.   Head: Normocephalic   Ears:   Right External Ears: Pinna normal, no lesions or deformities   Right Otoscopic: Ear canal with cerumen, tympanic membrane intact with good movement, no evidence of infection or effusion  Left External Ears: Pinna normal, no lesions or deformities   Left Otoscopic: Ear canal with cerumen, tympanic membrane intact with good movement, no evidence of infection or effusion   Orientation: Oriented to time, place, and person   Mood and affect: No depression, anxiety, or agitation   Voice: Clear    CERUMEN IMPACTION PROCEDURE NOTE    INDICATION: obstructive, copious cerumen  cannot be removed without magnification and multiple instrumentation  PROCEDURE NOTE on cerumen removal (bilateral)  INDICATION: severe cerumen impaction  PROCEDURE DONE: Removal of impacted cerumen  Anesthesia used: NONE  Pre procedure Dx: Cerumen impaction (AU side)  Post procedure Dx: Cerumen impaction (AU side)     Instruments were used (not irrigation) during the procedure. Under the microscope, cerumen was removed with a micro-otologcal instrument including  a cerumen curette, a Rosen needle, and a a right-angle hook.     Findings:  Right ear canal: Cerumen impaction that was removed as above  Right TM: intact without infection/effusion  Left ear canal: Cerumen impaction that was removed as above  Left TM: intact without infection/effusion    The patient tolerated the procedure well and there were no complications.    Audiogram:      ASSESSMENT/PLAN:    ICD-10-CM ICD-9-CM    1. Bilateral impacted cerumen  H61.23 380.4         Audiogram was reviewed in detail. We discussed avoidance of putting anything in her ear including q-tips. She can use mineral oil to the ear twice a month as needed for ear itching/dryness. Return to Clinic as needed  for any worsening ear complaints.    I devoted a total of 45 minutes to face-to-face and non-face-to-face activities pertinent to the patient's visit on this date, not including time spent on services or procedures that are reportable separately.    Tawni Pickup, CRNP  Nurse Practitioner  [facility]   Otolaryngology/ Head and Neck Surgery  [address]  [place] [address]  Phone: [phone]

## 2024-01-10 ENCOUNTER — Encounter (HOSPITAL_COMMUNITY): Payer: Self-pay

## 2024-01-11 ENCOUNTER — Ambulatory Visit
Admission: RE | Admit: 2024-01-11 | Discharge: 2024-01-11 | Disposition: A | Discharge status: Home / Self Care | Attending: Nurse Practitioner | Admitting: Nurse Practitioner

## 2024-01-11 VITALS — BP 99/56 | HR 83 | Temp 97.3°F | Resp 16

## 2024-01-11 DIAGNOSIS — T300 Burn of unspecified body region, unspecified degree: Secondary | ICD-10-CM

## 2024-01-11 DIAGNOSIS — X158XXA Contact with other hot household appliances, initial encounter: Secondary | ICD-10-CM

## 2024-01-11 DIAGNOSIS — T310 Burns involving less than 10% of body surface: Secondary | ICD-10-CM

## 2024-01-11 DIAGNOSIS — T22211A Burn of second degree of right forearm, initial encounter: Secondary | ICD-10-CM | POA: Insufficient documentation
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
## 2024-01-11 NOTE — Addendum Note (Signed)
 Encounter addended by: Kathlene Ned, RN on: 01/11/2024 4:53 PM   Actions taken: Image edited

## 2024-01-11 NOTE — Addendum Note (Signed)
 Encounter addended by: Kathlene Ned, RN on: 01/11/2024 4:58 PM   Actions taken: Image imported
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
